# Patient Record
Sex: Female | Born: 1965 | Race: White | Hispanic: No | Smoking: Current every day smoker
Health system: Southern US, Community
[De-identification: ages and names within clinical notes are randomized; demographics above are authoritative.]

## PROBLEM LIST (undated history)

## (undated) DIAGNOSIS — J189 Pneumonia, unspecified organism: Secondary | ICD-10-CM

## (undated) DIAGNOSIS — F419 Anxiety disorder, unspecified: Secondary | ICD-10-CM

## (undated) DIAGNOSIS — K219 Gastro-esophageal reflux disease without esophagitis: Secondary | ICD-10-CM

## (undated) DIAGNOSIS — K5090 Crohn's disease, unspecified, without complications: Secondary | ICD-10-CM

## (undated) DIAGNOSIS — F329 Major depressive disorder, single episode, unspecified: Secondary | ICD-10-CM

## (undated) DIAGNOSIS — G4700 Insomnia, unspecified: Secondary | ICD-10-CM

## (undated) DIAGNOSIS — I10 Essential (primary) hypertension: Secondary | ICD-10-CM

## (undated) DIAGNOSIS — F319 Bipolar disorder, unspecified: Secondary | ICD-10-CM

## (undated) DIAGNOSIS — F32A Depression, unspecified: Secondary | ICD-10-CM

## (undated) DIAGNOSIS — J45909 Unspecified asthma, uncomplicated: Secondary | ICD-10-CM

## (undated) HISTORY — DX: Gastro-esophageal reflux disease without esophagitis: K21.9

## (undated) HISTORY — DX: Unspecified asthma, uncomplicated: J45.909

## (undated) HISTORY — PX: KNEE SURGERY: SHX244

## (undated) HISTORY — PX: SHOULDER SURGERY: SHX246

## (undated) HISTORY — DX: Anxiety disorder, unspecified: F41.9

## (undated) HISTORY — DX: Pneumonia, unspecified organism: J18.9

## (undated) HISTORY — PX: ABDOMINAL HYSTERECTOMY: SHX81

## (undated) HISTORY — DX: Essential (primary) hypertension: I10

## (undated) HISTORY — PX: BACK SURGERY: SHX140

## (undated) HISTORY — PX: TUBAL LIGATION: SHX77

---

## 2003-11-07 ENCOUNTER — Emergency Department: Payer: Self-pay | Admitting: Internal Medicine

## 2003-11-23 ENCOUNTER — Emergency Department: Payer: Self-pay | Admitting: Emergency Medicine

## 2003-12-23 ENCOUNTER — Emergency Department: Payer: Self-pay | Admitting: Emergency Medicine

## 2004-02-01 ENCOUNTER — Emergency Department: Payer: Self-pay | Admitting: Emergency Medicine

## 2004-02-26 ENCOUNTER — Emergency Department (HOSPITAL_COMMUNITY): Admission: EM | Admit: 2004-02-26 | Discharge: 2004-02-26 | Payer: Self-pay | Admitting: Emergency Medicine

## 2004-06-12 ENCOUNTER — Emergency Department: Payer: Self-pay | Admitting: Emergency Medicine

## 2004-07-09 ENCOUNTER — Emergency Department: Payer: Self-pay | Admitting: Internal Medicine

## 2004-11-16 ENCOUNTER — Emergency Department: Payer: Self-pay | Admitting: Emergency Medicine

## 2004-11-26 ENCOUNTER — Emergency Department: Payer: Self-pay | Admitting: Unknown Physician Specialty

## 2006-01-10 ENCOUNTER — Emergency Department (HOSPITAL_COMMUNITY): Admission: EM | Admit: 2006-01-10 | Discharge: 2006-01-10 | Payer: Self-pay | Admitting: Emergency Medicine

## 2006-01-18 ENCOUNTER — Emergency Department (HOSPITAL_COMMUNITY): Admission: EM | Admit: 2006-01-18 | Discharge: 2006-01-18 | Payer: Self-pay | Admitting: Emergency Medicine

## 2006-12-03 ENCOUNTER — Ambulatory Visit (HOSPITAL_COMMUNITY): Admission: RE | Admit: 2006-12-03 | Discharge: 2006-12-03 | Payer: Self-pay | Admitting: Internal Medicine

## 2007-01-07 IMAGING — CR RIGHT ANKLE - COMPLETE 3+ VIEW
1 series · 3 of 3 positions shown · non-contrast
Comparison: none

REASON FOR EXAM: Pain
COMMENTS:

PROCEDURE:     DXR - DXR ANKLE RIGHT COMPLETE  - June 13, 2004 [DATE]
RESULT:     Five views of the RIGHT ankle show no fracture, dislocation or
other acute bony abnormality. The ankle mortise is well maintained.

[Series 1: view not recorded · 0.17mm/px · 3 of 3 slices shown]
[im 1/3]
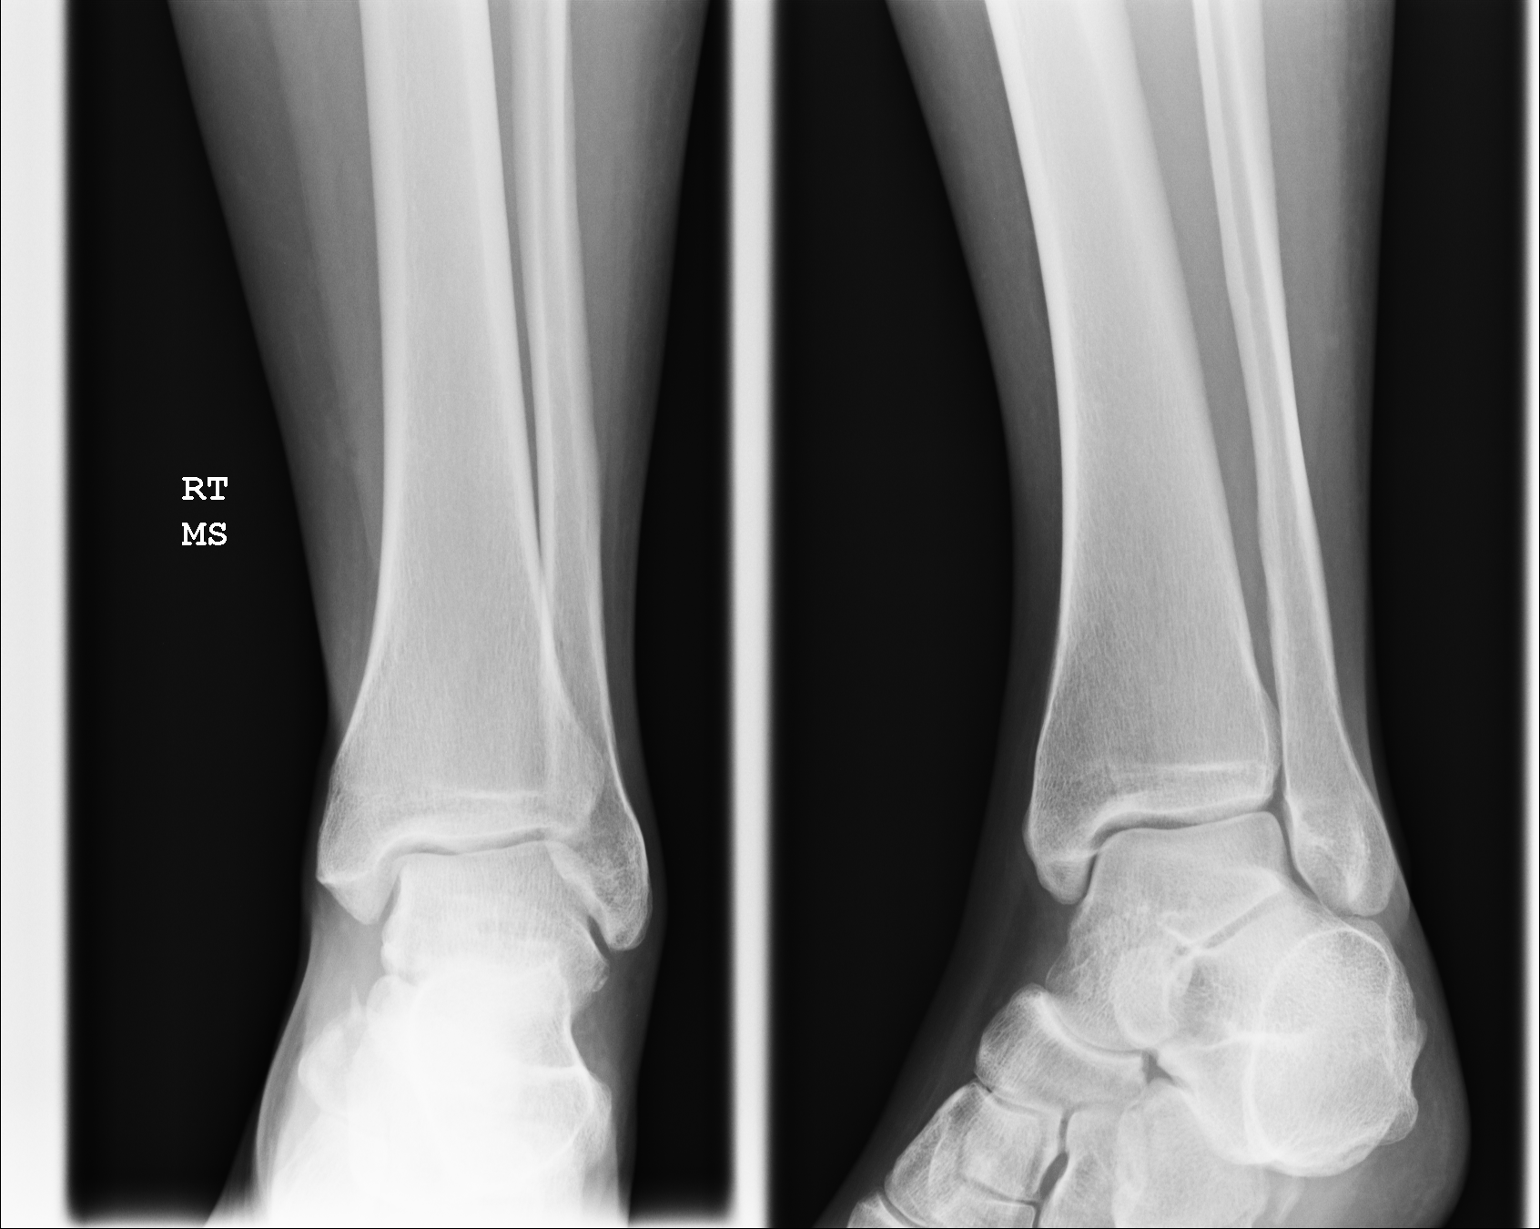
[im 2/3]
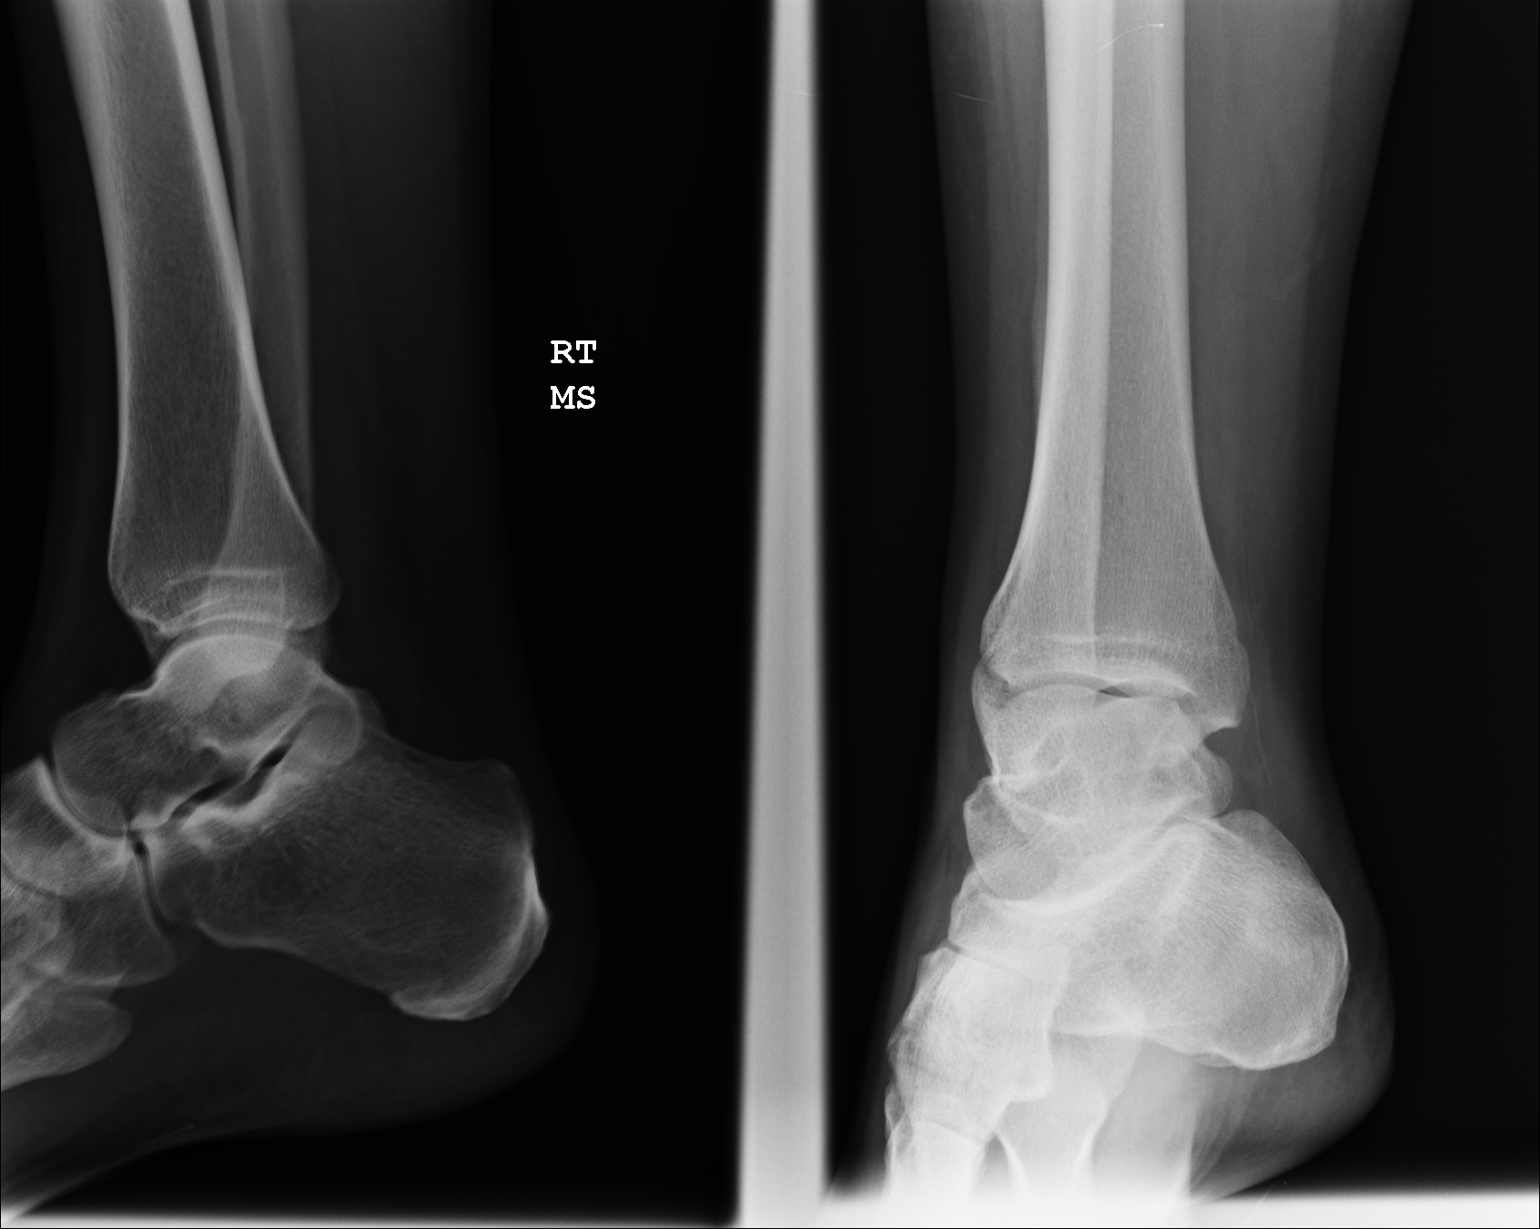
[im 3/3]
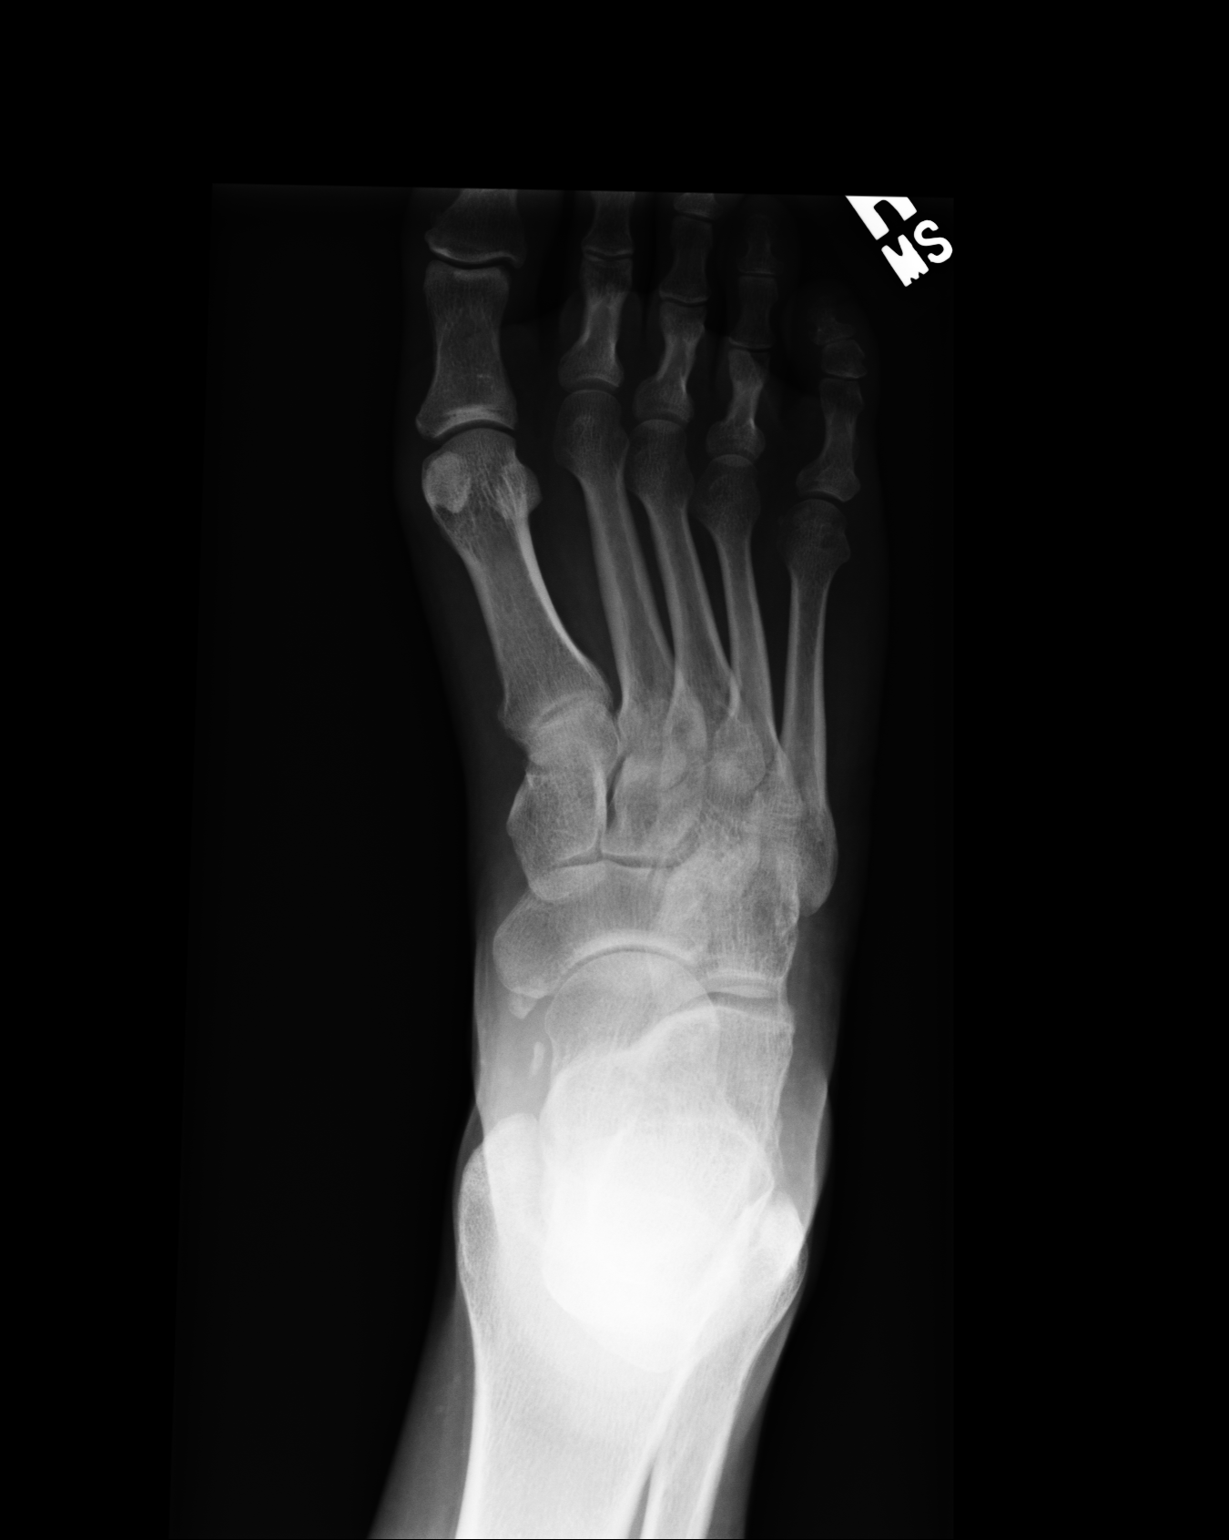

[3 of 3 positions shown; findings below may reference images not displayed]

IMPRESSION: No significant osseous abnormalities are noted.

## 2009-06-01 ENCOUNTER — Encounter: Admission: RE | Admit: 2009-06-01 | Discharge: 2009-06-01 | Payer: Self-pay | Admitting: Neurosurgery

## 2009-08-11 ENCOUNTER — Emergency Department (HOSPITAL_COMMUNITY): Admission: EM | Admit: 2009-08-11 | Discharge: 2009-08-11 | Payer: Self-pay | Admitting: Emergency Medicine

## 2009-11-02 ENCOUNTER — Emergency Department (HOSPITAL_COMMUNITY)
Admission: EM | Admit: 2009-11-02 | Discharge: 2009-11-02 | Payer: Self-pay | Source: Home / Self Care | Admitting: Emergency Medicine

## 2010-01-22 ENCOUNTER — Emergency Department (HOSPITAL_COMMUNITY)
Admission: EM | Admit: 2010-01-22 | Discharge: 2010-01-22 | Payer: Self-pay | Source: Home / Self Care | Admitting: Emergency Medicine

## 2010-02-11 ENCOUNTER — Encounter: Payer: Self-pay | Admitting: Neurosurgery

## 2010-04-02 ENCOUNTER — Ambulatory Visit (INDEPENDENT_AMBULATORY_CARE_PROVIDER_SITE_OTHER): Payer: Self-pay | Admitting: Internal Medicine

## 2010-05-28 ENCOUNTER — Ambulatory Visit (INDEPENDENT_AMBULATORY_CARE_PROVIDER_SITE_OTHER): Payer: Medicare Other | Admitting: Internal Medicine

## 2010-05-28 DIAGNOSIS — K5000 Crohn's disease of small intestine without complications: Secondary | ICD-10-CM

## 2010-05-28 DIAGNOSIS — K219 Gastro-esophageal reflux disease without esophagitis: Secondary | ICD-10-CM

## 2010-05-28 DIAGNOSIS — K5900 Constipation, unspecified: Secondary | ICD-10-CM

## 2010-05-28 DIAGNOSIS — R109 Unspecified abdominal pain: Secondary | ICD-10-CM

## 2010-06-08 NOTE — H&P (Signed)
NAME:  Amy Price                            ACCOUNT NO.:  [id number]   MEDICAL RECORD NO.:  [id number]                  PATIENT TYPE:   LOCATION:                                       FACILITY:  [facility]   PHYSICIAN:  R. Roetta Sessions, M.D.              DATE OF BIRTH:  1965-05-30   DATE OF ADMISSION:  07/12/2003  DATE OF DISCHARGE:                                HISTORY & PHYSICAL   PRIMARY CARE PHYSICIAN:  Dr. Burna Mortimer   CHIEF COMPLAINT:  Crohn's disease.   HISTORY OF PRESENT ILLNESS:  [name] is a 45 year old Caucasian female who  has been followed by Dr. Karilyn Cota for small bowel Crohn's disease.  She was  initially diagnosed in 51.  She had multiple hospitalizations which  resulted in a right hemicolectomy in November 1995.  She has been on  mesalamine.  She notes over the past month she has been out of all of her  medications due to a recent move.  Today she is complaining of rectal pain  as well as nausea, vomiting, heartburn, and indigestion however, she notes  discontinuing her Prilosec as well.  When she was on her PPI, she noted  resolution of her symptoms.  She is complaining of mucousy stools that are  loose and watery three or more times per day.  She denies any melena or  rectal bleeding.  Currently she is complaining of low-grade fever less than  100.  She is complaining of occasional abdominal cramping as well.  She  states she has not had colonoscopy since diagnosis in 1991.  She had small-  bowel follow-through in 2002 which did not show active disease at that time.   CURRENT MEDICATIONS:  1. OxyContin 40 mg q.i.d. for chronic back pain.  2. Neurontin 300 mg three at bedtime.  3. Trazodone 100 mg at bedtime.   ALLERGIES:  ASPIRIN, MORPHINE, CODEINE, and DARVOCET-N 100.   PAST MEDICAL HISTORY:  1. Insomnia.  2. Anxiety.  3. Crohn's disease diagnosed in 1991 status post right hemicolectomy in     November 1995, see HPI.  4. Chronic reflux esophagitis.  5.  Chronic back pain status post three surgeries 1996, 1997, and 1998     including fusion of her L-spine.  6. Tubal ligation.   FAMILY HISTORY:  She does have one son with reported ulcerative colitis.  No  known family history of colorectal carcinoma, liver or chronic GI problems.  She does have a mother with dermatomyositis, one sister with HCV and two  brothers alive and well.   SOCIAL HISTORY:  [name] is separated.  She lives with her boyfriend.  She  has a 64 year old and a 78 year old, one with colitis.  She is currently  disabled.  She reports smoking half a pack per day for the last 26 years.  She denies any alcohol or drug use.  REVIEW OF SYSTEMS:  CONSTITUTIONAL:  Weight is steadily increasing.  Appetite is okay.  She is complaining of some fatigue.  CARDIOVASCULAR:  Occasional chest pain.  She denies any palpitations.  PULMONARY:  She is  complaining of a productive cough over the last several weeks.  Denies any  hemoptysis, shortness of breath, or dyspnea.  GI:  See HPI.  She denies any  dysphagia or odynophagia.   PHYSICAL EXAMINATION:  VITAL SIGNS:  Weight 164 pounds, blood pressure  100/70, pulse 70.  GENERAL:  [name] is a 45 year old well-developed, well-nourished  Caucasian female in no acute distress.  HEENT:  Sclerae are clear, nonicteric.  Conjunctivae pink.  Oropharynx pink  and moist.  She has poor dentition.  NECK:  Supple without any mass or thyromegaly.  CHEST:  Heart regular rate and rhythm with normal S1 and S2 without any  murmurs, clicks, rubs, or gallops.  Lungs clear to auscultation bilaterally.  ABDOMEN:  She does have an umbilical ornament intact, positive bowel sounds  x4, no bruits auscultated, slightly distended, soft, she does have some  tenderness to deep palpation to bilateral lower quadrants.  No palpable mass  or hepatosplenomegaly.  RECTAL:  Exam reveals good sphincter tone, no external or internal lesions  visualized, small amount of  Hemoccult-negative stool was obtained from the  vault.   ASSESSMENT:  [name] is a 45 year old Caucasian female with 14-year  history of small bowel Crohn's disease.  She has been off maintenance  medications for 1 month now due to a recent move.  She is complaining of  some proctalgia as well as loose mucousy stools.  She denies any rectal  bleeding at this time.  Her symptoms appear to be somewhat quiescent  although further evaluation is necessary to assess the status of her Crohn's  disease at this time.   RECOMMENDATIONS:  1. Will schedule colonoscopy with terminal ileoscopy in the near future.  I     have discussed this procedure including risks and benefits which include     but are not limited to bleeding, infection, perforation, or drug     reaction.  She agrees with the plan.  Consent will be obtained.  Labs     today to include CBC, sed rate and complete metabolic panel.  She is to     follow up with Dr. Burna Mortimer for a full physical exam.  2. Will resume Prilosec 20 mg daily for her chronic GERD (#30 with six     refills), Levsin sublingual one before every meal and at bedtime p.r.n.     abdominal cramping (#60 with no refills).  3. Pentasa 250 mg one tab p.o. q.i.d., however this may need to be adjusted     depending on her disease state.     _____________________________________  ___________________________________________  Nicholas Lose, N.P.               Jonathon Bellows, M.D.   [facility]  D:  07/13/2003  T:  07/13/2003  Job:  [id number]   cc:   Dr. Burna Mortimer

## 2010-06-12 ENCOUNTER — Other Ambulatory Visit: Payer: Self-pay | Admitting: Medical

## 2010-07-04 ENCOUNTER — Other Ambulatory Visit: Payer: Self-pay | Admitting: Medical

## 2010-09-17 ENCOUNTER — Encounter (INDEPENDENT_AMBULATORY_CARE_PROVIDER_SITE_OTHER): Payer: Self-pay | Admitting: *Deleted

## 2010-10-11 ENCOUNTER — Encounter: Payer: Self-pay | Admitting: *Deleted

## 2010-10-11 ENCOUNTER — Emergency Department (HOSPITAL_COMMUNITY)
Admission: EM | Admit: 2010-10-11 | Discharge: 2010-10-11 | Disposition: A | Discharge status: Home / Self Care | Payer: Medicare Other | Attending: Emergency Medicine | Admitting: Emergency Medicine

## 2010-10-11 DIAGNOSIS — G44209 Tension-type headache, unspecified, not intractable: Secondary | ICD-10-CM

## 2010-10-11 DIAGNOSIS — R51 Headache: Secondary | ICD-10-CM | POA: Insufficient documentation

## 2010-10-11 HISTORY — DX: Bipolar disorder, unspecified: F31.9

## 2010-10-11 HISTORY — DX: Crohn's disease, unspecified, without complications: K50.90

## 2010-10-11 HISTORY — DX: Major depressive disorder, single episode, unspecified: F32.9

## 2010-10-11 HISTORY — DX: Depression, unspecified: F32.A

## 2010-10-11 HISTORY — DX: Insomnia, unspecified: G47.00

## 2010-10-11 MED ORDER — OXYCODONE-ACETAMINOPHEN 5-325 MG PO TABS: 1.0000 | ORAL_TABLET | Freq: Four times a day (QID) | ORAL | Status: AC | PRN

## 2010-10-11 NOTE — ED Notes (Signed)
Pt c/o pain to back of head. Pt states she started hurting 3-4 weeks ago. Pt states she falls sometimes and does not know it d/t sleeping pills she takes.

## 2010-10-11 NOTE — ED Provider Notes (Signed)
History     CSN: [id number] Arrival date & time: 10/11/2010  3:20 PM  Chief Complaint  Patient presents with  . Headache    pain to back of head.    HPI  (Consider location/radiation/quality/duration/timing/severity/associated sxs/prior treatment)  HPI Pt reports she fell about 2 weeks ago hitting the back of her head. Apparently this is not uncommon for her when she takes her night time trazodone. She states since that time, she has had sharp severe pain in R occipital region, radiating to her frontal area. Some associated neck stiffness, also chronic. She has tried ibuprofen without improvement.  Past Medical History  Diagnosis Date  . Insomnia   . Crohn's disease   . Bipolar 1 disorder   . Migraine   . Depression     Past Surgical History  Procedure Date  . Back surgery   . Knee surgery   . Abdominal hysterectomy   . Tubal ligation     History reviewed. No pertinent family history.  History  Substance Use Topics  . Smoking status: Current Everyday Smoker -- 0.5 packs/day  . Smokeless tobacco: Not on file  . Alcohol Use: No    OB History    Grav Para Term Preterm Abortions TAB SAB Ect Mult Living                  Review of Systems  Review of Systems All other systems reviewed and are negative except as noted in HPI.   Allergies  Aspirin; Darvocet; Imitrex; and Tramadol  Home Medications   Current Outpatient Rx  Name Route Sig Dispense Refill  . TRAZODONE HCL 150 MG PO TABS Oral Take 450 mg by mouth at bedtime.        Physical Exam    BP 137/82  Pulse 102  Temp(Src) 98.5 F (36.9 C) (Oral)  Resp 20  Ht 5\' 7"  (1.702 m)  Wt 240 lb (108.863 kg)  BMI 37.59 kg/m2  SpO2 99%  Physical Exam  Nursing note and vitals reviewed. Constitutional: She is oriented to person, place, and time. She appears well-developed and well-nourished.  HENT:  Head: Normocephalic and atraumatic.       R occipital scalp tenderness without hematoma, rash or laceration    Eyes: EOM are normal. Pupils are equal, round, and reactive to light.  Neck: Normal range of motion. Neck supple.       nontender in midline, paraspinal tenderness  Cardiovascular: Normal rate, normal heart sounds and intact distal pulses.   Pulmonary/Chest: Effort normal and breath sounds normal.  Abdominal: Bowel sounds are normal. She exhibits no distension. There is no tenderness.  Musculoskeletal: Normal range of motion. She exhibits no edema and no tenderness.  Neurological: She is alert and oriented to person, place, and time. She has normal strength. No cranial nerve deficit or sensory deficit.  Skin: Skin is warm and dry. No rash noted.  Psychiatric: She has a normal mood and affect.    ED Course  Procedures (including critical care time)    MDM There is no concern for clinically significant intracranial injury. Suspect tension headache vs chronic pain syndrome. Given short course of narcotics. Advised not to take them at night with trazodone due to additive sedative effects. PCP followup.         Charles B. Bernette Mayers, MD 10/11/10 [id number]

## 2010-11-13 ENCOUNTER — Encounter (INDEPENDENT_AMBULATORY_CARE_PROVIDER_SITE_OTHER): Payer: Self-pay | Admitting: Internal Medicine

## 2010-11-13 ENCOUNTER — Ambulatory Visit (INDEPENDENT_AMBULATORY_CARE_PROVIDER_SITE_OTHER): Payer: Medicare Other | Admitting: Internal Medicine

## 2010-11-13 VITALS — BP 120/90 | HR 78 | Temp 97.8°F | Resp 16 | Ht 67.0 in | Wt 259.0 lb

## 2010-11-13 DIAGNOSIS — R635 Abnormal weight gain: Secondary | ICD-10-CM

## 2010-11-13 DIAGNOSIS — R1011 Right upper quadrant pain: Secondary | ICD-10-CM

## 2010-11-13 DIAGNOSIS — J329 Chronic sinusitis, unspecified: Secondary | ICD-10-CM

## 2010-11-13 LAB — HEPATIC FUNCTION PANEL
Albumin: 4.1 g/dL (ref 3.5–5.2)
Total Protein: 7.1 g/dL (ref 6.0–8.3)

## 2010-11-13 MED ORDER — CEPHALEXIN 500 MG PO CAPS: 500.0000 mg | ORAL_CAPSULE | Freq: Four times a day (QID) | ORAL | Status: AC

## 2010-11-13 NOTE — Patient Instructions (Addendum)
FYI. You have gained 42 pounds in the last 5 months. Need to increase physical activity or walking. Physician will contact you with results of blood test and ultrasound

## 2010-11-14 NOTE — Progress Notes (Signed)
Presenting complaint; Followup for Crohn's disease and GERD. Patient complains of right upper quadrant pain.  Subjective: Patient is 45 year old Caucasian female who is in for scheduled visit with multiple complaints. She was last seen in may this year. She complains of pain under the right rib cage radiating posteriorly worse after meals and associated with intermittent nausea and vomiting. She has not experienced  fever or chills. This pain started a few weeks ago. She states she has great appetite and just cannot control herself. She has gained 42 pounds in less than 6 months. She remains with irregular bowel movements. She either has diarrhea and or constipation. She denies melena or frank rectal bleeding. She has occasional hematochezia with straining. She also complains of headache in the frontal and maxillary regions and has thick greenish nasal discharge. She states her her medication for bipolar disorder is being changed. Current medications Current Outpatient Prescriptions  Medication Sig Dispense Refill  . amitriptyline (ELAVIL) 50 MG tablet Take 50 mg by mouth at bedtime.        . benztropine (COGENTIN) 1 MG tablet Take 1 mg by mouth Nightly.        . chlorproMAZINE (THORAZINE) 25 MG tablet Take 25 mg by mouth. At bedtime        . gabapentin (NEURONTIN) 600 MG tablet Take 600 mg by mouth 4 (four) times daily.        Marland Kitchen ibuprofen (ADVIL,MOTRIN) 800 MG tablet Take 800 mg by mouth as needed.        . OXcarbazepine (TRILEPTAL) 150 MG tablet Take 150 mg by mouth. Takes 3 by mouth at bedtime       . pantoprazole (PROTONIX) 40 MG tablet Take 40 mg by mouth 2 (two) times daily.        . traZODone (DESYREL) 150 MG tablet Take 450 mg by mouth at bedtime. Takes 1/2 tablet at bedtime      . cephALEXin (KEFLEX) 500 MG capsule Take 1 capsule (500 mg total) by mouth 4 (four) times daily.  40 capsule  0     Objective: BP 120/90  Pulse 78  Temp(Src) 97.8 F (36.6 C) (Oral)  Resp 16  Ht 5\' 7"   (1.702 m)  Wt 259 lb (117.482 kg)  BMI 40.57 kg/m2 Conjunctiva is pink. Sclera is nonicteric. She has mild tenderness over frontal and maxillary sinuses oropharyngeal mucosa is normal. No neck masses or thyromegaly noted. Lungs are clear to auscultation. Abdomen is full bowel sounds are normal. He has mild tenderness below the right costal margin but no organomegaly or masses noted. No peripheral edema or clubbing the    Assessment: #1. Crohn's disease. She appears to be in remission presently on no medications. Her irregular bowel movements appear to be due to irritable bowel syndrome. #2. Obesity. Her BMI is over 40. She has gained 42 pounds in less than 6 months. This may be related to her medications. Needs to rule out hypothyroidism #3. Maxillary and frontal sinusitis. #4. Right upper quadrant pain with nausea and vomiting rule out biliary tract disease. #5. Chronic GERD.     Plan: Keflex.500 mg 4 times a day for 10 days Right upper quadrant ultrasound. LFTs and TSH level.    [name],[name]  11/14/2010, 12:28 AM

## 2010-11-15 ENCOUNTER — Ambulatory Visit (HOSPITAL_COMMUNITY)
Admission: RE | Admit: 2010-11-15 | Discharge: 2010-11-15 | Disposition: A | Discharge status: Home / Self Care | Payer: Medicare Other | Source: Ambulatory Visit | Attending: Internal Medicine | Admitting: Internal Medicine

## 2010-11-15 DIAGNOSIS — R935 Abnormal findings on diagnostic imaging of other abdominal regions, including retroperitoneum: Secondary | ICD-10-CM | POA: Insufficient documentation

## 2010-11-15 DIAGNOSIS — R1011 Right upper quadrant pain: Secondary | ICD-10-CM | POA: Insufficient documentation

## 2011-02-19 ENCOUNTER — Ambulatory Visit (INDEPENDENT_AMBULATORY_CARE_PROVIDER_SITE_OTHER): Payer: Medicare Other | Admitting: Urology

## 2011-02-19 ENCOUNTER — Other Ambulatory Visit (INDEPENDENT_AMBULATORY_CARE_PROVIDER_SITE_OTHER): Payer: Self-pay | Admitting: Internal Medicine

## 2011-02-19 DIAGNOSIS — R3913 Splitting of urinary stream: Secondary | ICD-10-CM

## 2011-02-19 DIAGNOSIS — R350 Frequency of micturition: Secondary | ICD-10-CM

## 2011-02-19 DIAGNOSIS — R39198 Other difficulties with micturition: Secondary | ICD-10-CM

## 2011-04-02 ENCOUNTER — Ambulatory Visit: Payer: Medicare Other | Admitting: Urology

## 2011-05-28 ENCOUNTER — Ambulatory Visit: Payer: Medicare Other | Admitting: Urology

## 2011-06-06 DIAGNOSIS — R072 Precordial pain: Secondary | ICD-10-CM

## 2011-09-17 ENCOUNTER — Other Ambulatory Visit (INDEPENDENT_AMBULATORY_CARE_PROVIDER_SITE_OTHER): Payer: Self-pay | Admitting: Internal Medicine

## 2012-02-17 ENCOUNTER — Ambulatory Visit (INDEPENDENT_AMBULATORY_CARE_PROVIDER_SITE_OTHER): Payer: PRIVATE HEALTH INSURANCE | Admitting: Internal Medicine

## 2012-02-17 ENCOUNTER — Encounter (INDEPENDENT_AMBULATORY_CARE_PROVIDER_SITE_OTHER): Payer: Self-pay | Admitting: Internal Medicine

## 2012-02-17 VITALS — BP 100/64 | HR 60 | Temp 98.3°F | Ht 67.0 in | Wt 266.9 lb

## 2012-02-17 DIAGNOSIS — G4700 Insomnia, unspecified: Secondary | ICD-10-CM | POA: Insufficient documentation

## 2012-02-17 DIAGNOSIS — F319 Bipolar disorder, unspecified: Secondary | ICD-10-CM | POA: Insufficient documentation

## 2012-02-17 DIAGNOSIS — F329 Major depressive disorder, single episode, unspecified: Secondary | ICD-10-CM | POA: Insufficient documentation

## 2012-02-17 DIAGNOSIS — K5090 Crohn's disease, unspecified, without complications: Secondary | ICD-10-CM

## 2012-02-17 DIAGNOSIS — I10 Essential (primary) hypertension: Secondary | ICD-10-CM | POA: Insufficient documentation

## 2012-02-17 LAB — CBC WITH DIFFERENTIAL/PLATELET
Basophils Absolute: 0 10*3/uL (ref 0.0–0.1)
Basophils Relative: 0 % (ref 0–1)
Eosinophils Absolute: 0.2 10*3/uL (ref 0.0–0.7)
Eosinophils Relative: 3 % (ref 0–5)
Lymphs Abs: 2.7 10*3/uL (ref 0.7–4.0)
MCHC: 34.7 g/dL (ref 30.0–36.0)
MCV: 84.9 fL (ref 78.0–100.0)
Monocytes Absolute: 0.6 10*3/uL (ref 0.1–1.0)
WBC: 7.5 10*3/uL (ref 4.0–10.5)

## 2012-02-17 NOTE — Progress Notes (Signed)
Subjective:     Patient ID: Amy Price, female   DOB: 13-May-1965, 47 y.o.   MRN: [id number]  HPIHere today with c/o rectal bleeding. She tells me a couple of weeks ago she had rectal bleeding. She noticed bright red bleeding on the toilet tissue. She says it occurred for several days and then stopped. No rectal bleeding since last week.  She has a hx of Crohn's disease dating back to 19091. She is status post right hemicolectomy in November 1994.  She has gained over 40 pounds since her last visit in May of 2012. She has a BM once a day. Today she has already had 3 BMs. Her stools are green in color. No melena or bright red rectal bleeding. She has not been on Pentasa for approximately 2 yrs. She tells me she was passing the pills whole. Occasional pain with her BMs. Occasional abdominal. She does have a lot of gas. Sometimes she will have fecal incontinence  at night.  Colonoscopy 2009 Dr. Karilyn Cota:  Colon, sigmoid, biopsy: No significant abnormality identified Review of Systems see hpi Current Outpatient Prescriptions  Medication Sig Dispense Refill  . amitriptyline (ELAVIL) 50 MG tablet Take 50 mg by mouth at bedtime.       . benztropine (COGENTIN) 1 MG tablet Take 1 mg by mouth Nightly.        . chlorproMAZINE (THORAZINE) 25 MG tablet Take 25 mg by mouth. At bedtime        . gabapentin (NEURONTIN) 600 MG tablet Take 600 mg by mouth 4 (four) times daily.        Marland Kitchen ibuprofen (ADVIL,MOTRIN) 800 MG tablet Take 800 mg by mouth as needed.        . OXcarbazepine (TRILEPTAL) 150 MG tablet Take 150 mg by mouth. Takes 3 by mouth at bedtime       . pantoprazole (PROTONIX) 40 MG tablet TAKE 1 TABLET BY MOUTH TWICE DAILY  60 tablet  5  . traZODone (DESYREL) 150 MG tablet Take 450 mg by mouth at bedtime. Takes 1/2 tablet at bedtime       Past Medical History  Diagnosis Date  . Insomnia   . Crohn's disease   . Bipolar 1 disorder   . Migraine   . Depression    Past Surgical History  Procedure  Date  . Back surgery   . Knee surgery   . Abdominal hysterectomy   . Tubal ligation   . Back surgery     stimulator removed   .    Objective:   Physical Exam Filed Vitals:   02/17/12 1007  BP: 100/64  Pulse: 60  Temp: 98.3 F (36.8 C)  Height: 5\' 7"  (1.702 m)  Weight: 266 lb 14.4 oz (121.065 kg)   Alert and oriented. Skin warm and dry. Oral mucosa is moist.   . Sclera anicteric, conjunctivae is pink. Thyroid not enlarged. No cervical lymphadenopathy. Lungs clear. Heart regular rate and rhythm.  Abdomen is soft. Bowel sounds are positive. No hepatomegaly. No abdominal masses felt. No tenderness.  No edema to lower extremities. Stool brown and guaiac negative.    Assessment:    Crohn's disease. I think she is in remission. Off Pentasa for 2 yrs.  Discussed this case with Dr Karilyn Cota    Plan:    OV in 1 yr. CBC and CRP today. Rx for Pentasa 1gm QID

## 2012-02-17 NOTE — Patient Instructions (Addendum)
CBC, CRP, OV in 1 yr. pentasa 1gm qid. Discussed with Dr. Karilyn Cota

## 2012-02-20 ENCOUNTER — Telehealth (INDEPENDENT_AMBULATORY_CARE_PROVIDER_SITE_OTHER): Payer: Self-pay | Admitting: Internal Medicine

## 2012-02-20 NOTE — Telephone Encounter (Signed)
Reviewed

## 2012-05-26 ENCOUNTER — Other Ambulatory Visit (INDEPENDENT_AMBULATORY_CARE_PROVIDER_SITE_OTHER): Payer: Self-pay | Admitting: Internal Medicine

## 2012-10-07 ENCOUNTER — Ambulatory Visit: Payer: Self-pay | Admitting: Family Medicine

## 2012-10-16 ENCOUNTER — Ambulatory Visit (INDEPENDENT_AMBULATORY_CARE_PROVIDER_SITE_OTHER): Payer: Medicare Other | Admitting: Family Medicine

## 2012-10-16 ENCOUNTER — Encounter: Payer: Self-pay | Admitting: Family Medicine

## 2012-10-16 VITALS — BP 98/69 | HR 69 | Temp 97.5°F | Ht 68.0 in | Wt 254.0 lb

## 2012-10-16 DIAGNOSIS — F411 Generalized anxiety disorder: Secondary | ICD-10-CM

## 2012-10-16 MED ORDER — ALPRAZOLAM 1 MG PO TABS: 1.0000 mg | ORAL_TABLET | Freq: Three times a day (TID) | ORAL | Status: DC | PRN

## 2012-10-16 NOTE — Progress Notes (Signed)
  Subjective:    Patient ID: Amy Price, female    DOB: 09-19-1965, 47 y.o.   MRN: [id number]  HPI This 47 y.o. female presents for evaluation of establishment visit.  She has hx Of bipolar disorder and she needs refill on xanax because her pain doctor does not rx it anymore.  She is seeing Dr. 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  She has hx of back surgery and knee surgery.  She has hx of Hypertension,chron's disease, and bipolar disorder.  She is accompanied by her Sister.  She states she has been sleeping a lot and would like to have some of her Psychiatric medicine adjusted.  She is not seeing psychiatry.  She states she was told She needs haldol shots .   Review of Systems    No chest pain, SOB, HA, dizziness, vision change, N/V, diarrhea, constipation, dysuria, urinary urgency or frequency, myalgias, arthralgias or rash.  Objective:   Physical Exam   Vital signs noted  Well developed well nourished female.  HEENT - Head atraumatic Normocephalic                Eyes - PERRLA, Conjuctiva - clear Sclera- Clear EOMI                Ears - EAC's Wnl TM's Wnl Gross Hearing WNL                Nose - Nares patent                 Throat - oropharanx wnl Respiratory - Lungs CTA bilateral Cardiac - RRR S1 and S2 without murmur GI - Abdomen soft Nontender and bowel sounds active x 4 Extremities - No edema. Neuro - Grossly intact.     Assessment & Plan:  Anxiety state, unspecified - Plan: ALPRAZolam Prudy Feeler) 1 MG tablet Informed patient that she needs to see a psychiatrist and a hand out is given to patient to get A psychiatrist.  I discussed with patient that I will bridge her until she is seen by psychiatry.

## 2012-10-16 NOTE — Patient Instructions (Signed)

## 2012-12-14 ENCOUNTER — Encounter: Payer: Self-pay | Admitting: *Deleted

## 2012-12-15 ENCOUNTER — Other Ambulatory Visit: Payer: Self-pay | Admitting: Family Medicine

## 2012-12-21 MED ORDER — CHLORPROMAZINE HCL 25 MG PO TABS: ORAL_TABLET | ORAL | Status: DC

## 2012-12-21 NOTE — Telephone Encounter (Signed)
SEE LAST OFFICE NOTE, I DONT THINK U HAVE PRESCRIBED THIS

## 2013-01-01 ENCOUNTER — Telehealth: Payer: Self-pay | Admitting: Family Medicine

## 2013-01-15 ENCOUNTER — Ambulatory Visit: Payer: Medicare Other | Admitting: Family Medicine

## 2013-01-18 ENCOUNTER — Ambulatory Visit (INDEPENDENT_AMBULATORY_CARE_PROVIDER_SITE_OTHER): Payer: Medicare Other | Admitting: Family Medicine

## 2013-01-18 ENCOUNTER — Encounter: Payer: Self-pay | Admitting: Family Medicine

## 2013-01-18 VITALS — BP 135/88 | HR 108 | Temp 96.7°F | Ht 68.0 in | Wt 236.0 lb

## 2013-01-18 DIAGNOSIS — G4700 Insomnia, unspecified: Secondary | ICD-10-CM

## 2013-01-18 DIAGNOSIS — M129 Arthropathy, unspecified: Secondary | ICD-10-CM

## 2013-01-18 DIAGNOSIS — I10 Essential (primary) hypertension: Secondary | ICD-10-CM

## 2013-01-18 DIAGNOSIS — G894 Chronic pain syndrome: Secondary | ICD-10-CM

## 2013-01-18 DIAGNOSIS — M1990 Unspecified osteoarthritis, unspecified site: Secondary | ICD-10-CM

## 2013-01-18 DIAGNOSIS — R51 Headache: Secondary | ICD-10-CM

## 2013-01-18 MED ORDER — METOPROLOL SUCCINATE ER 25 MG PO TB24: 25.0000 mg | ORAL_TABLET | Freq: Every day | ORAL | Status: AC

## 2013-01-18 MED ORDER — DULOXETINE HCL 60 MG PO CPEP: 60.0000 mg | ORAL_CAPSULE | Freq: Every day | ORAL | Status: DC

## 2013-01-18 MED ORDER — TIZANIDINE HCL 2 MG PO TABS: 2.0000 mg | ORAL_TABLET | Freq: Three times a day (TID) | ORAL | Status: DC | PRN

## 2013-01-18 MED ORDER — TOPIRAMATE 25 MG PO TABS: 50.0000 mg | ORAL_TABLET | Freq: Every day | ORAL | Status: AC

## 2013-01-18 MED ORDER — IBUPROFEN 800 MG PO TABS: 800.0000 mg | ORAL_TABLET | ORAL | Status: DC | PRN

## 2013-01-18 MED ORDER — AMITRIPTYLINE HCL 50 MG PO TABS: 50.0000 mg | ORAL_TABLET | Freq: Every day | ORAL | Status: DC

## 2013-01-18 MED ORDER — HYDROCHLOROTHIAZIDE 12.5 MG PO CAPS: 12.5000 mg | ORAL_CAPSULE | Freq: Every day | ORAL | Status: AC

## 2013-01-18 NOTE — Patient Instructions (Signed)

## 2013-01-18 NOTE — Progress Notes (Signed)
   Subjective:    Patient ID: Amy Price, female    DOB: 04/11/1965, 47 y.o.   MRN: [id number]  HPI This 47 y.o. female presents for evaluation of chronic pain.  She has been seen by Her orthopedic doctor for shoulder pain and was being rx'd oxycodone and she want a refill. She has chronic shoulder pain.  She has hx of migraine headaches, back pain, muscle spasms, Hypertension, bipolar disorder, anxiety disorder, chron's disease, depression, and insomnia.  She Was given a list of psychiatric providers to call and get established last visit and she has not done this. She is accompanied by her sister today.   Review of Systems No chest pain, SOB, HA, dizziness, vision change, N/V, diarrhea, constipation, dysuria, urinary urgency or frequency, myalgias, arthralgias or rash.     Objective:   Physical Exam Vital signs noted  Well developed well nourished female.  HEENT - Head atraumatic Normocephalic                Eyes - PERRLA, Conjuctiva - clear Sclera- Clear EOMI                Ears - EAC's Wnl TM's Wnl Gross Hearing WNL                Nose - Nares patent                 Throat - oropharanx wnl Respiratory - Lungs CTA bilateral Cardiac - RRR S1 and S2 without murmur GI - Abdomen soft Nontender and bowel sounds active x 4 Extremities - No edema. Neuro - Grossly intact.        Assessment & Plan:  Chronic pain syndrome - Plan: tiZANidine (ZANAFLEX) 2 MG tablet, Ambulatory referral to Pain Clinic, DULoxetine (CYMBALTA) 60 MG capsule, CBC With differential/Platelet.  Declined rx for oxycodone And will defer to pain management.  Insomnia - Plan: amitriptyline (ELAVIL) 50 MG tablet  Headache(784.0) - Plan: topiramate (TOPAMAX) 25 MG tablet, CBC With differential/Platelet  Essential hypertension, benign - Plan: hydrochlorothiazide (MICROZIDE) 12.5 MG capsule, metoprolol succinate (TOPROL-XL) 25 MG 24 hr tablet, BMP8+EGFR, Lipid panel, CBC With differential/Platelet, CANCELED:  POCT CBC  Arthritis - Plan: ibuprofen (ADVIL,MOTRIN) 800 MG tablet, CBC With differential/Platelet  Depression - Again advised her to get a psychiatric provider to rx her thorazine and xanax.  Deatra Canter FNP

## 2013-01-19 LAB — BMP8+EGFR
BUN/Creatinine Ratio: 5 — ABNORMAL LOW (ref 9–23)
BUN: 5 mg/dL — ABNORMAL LOW (ref 6–24)
CO2: 15 mmol/L — ABNORMAL LOW (ref 18–29)
Calcium: 9.6 mg/dL (ref 8.7–10.2)
Chloride: 105 mmol/L (ref 97–108)
Creatinine, Ser: 1 mg/dL (ref 0.57–1.00)
GFR calc Af Amer: 78 mL/min/{1.73_m2} (ref >59)
GFR calc non Af Amer: 67 mL/min/{1.73_m2} (ref >59)
Glucose: 103 mg/dL — ABNORMAL HIGH (ref 65–99)
Potassium: 4.2 mmol/L (ref 3.5–5.2)
Sodium: 141 mmol/L (ref 134–144)

## 2013-01-19 LAB — LIPID PANEL
Chol/HDL Ratio: 4 ratio (ref 0.0–4.4)
Cholesterol, Total: 186 mg/dL (ref 100–199)
HDL: 47 mg/dL
LDL Calculated: 100 mg/dL — ABNORMAL HIGH (ref 0–99)
Triglycerides: 193 mg/dL — ABNORMAL HIGH (ref 0–149)
VLDL Cholesterol Cal: 39 mg/dL (ref 5–40)

## 2013-01-19 LAB — CBC WITH DIFFERENTIAL
Basophils Absolute: 0 10*3/uL (ref 0.0–0.2)
Basos: 0 %
Eos: 3 %
Eosinophils Absolute: 0.2 10*3/uL (ref 0.0–0.4)
HCT: 45.9 % (ref 34.0–46.6)
Hemoglobin: 16.1 g/dL — ABNORMAL HIGH (ref 11.1–15.9)
Immature Grans (Abs): 0 10*3/uL (ref 0.0–0.1)
Immature Granulocytes: 0 %
Lymphocytes Absolute: 2.3 10*3/uL (ref 0.7–3.1)
Lymphs: 33 %
MCH: 31.9 pg (ref 26.6–33.0)
MCHC: 35.1 g/dL (ref 31.5–35.7)
MCV: 91 fL (ref 79–97)
Monocytes Absolute: 0.6 10*3/uL (ref 0.1–0.9)
Monocytes: 8 %
Neutrophils Absolute: 3.9 10*3/uL (ref 1.4–7.0)
Neutrophils Relative %: 56 %
Platelets: 279 10*3/uL (ref 150–379)
RBC: 5.05 x10E6/uL (ref 3.77–5.28)
RDW: 14.6 % (ref 12.3–15.4)
WBC: 7.1 10*3/uL (ref 3.4–10.8)

## 2013-02-01 ENCOUNTER — Telehealth: Payer: Self-pay

## 2013-02-01 NOTE — Telephone Encounter (Signed)
Suppose to be doing me a referral to Dr Jordan LikesSpivey Preferred pain management

## 2013-02-02 ENCOUNTER — Ambulatory Visit: Payer: Medicare Other | Admitting: Family Medicine

## 2013-02-03 ENCOUNTER — Encounter (INDEPENDENT_AMBULATORY_CARE_PROVIDER_SITE_OTHER): Payer: Self-pay | Admitting: *Deleted

## 2013-02-03 ENCOUNTER — Ambulatory Visit: Payer: Medicare Other | Admitting: Nurse Practitioner

## 2013-02-07 ENCOUNTER — Other Ambulatory Visit (INDEPENDENT_AMBULATORY_CARE_PROVIDER_SITE_OTHER): Payer: Self-pay | Admitting: Internal Medicine

## 2013-02-08 ENCOUNTER — Encounter (HOSPITAL_COMMUNITY): Payer: Self-pay | Admitting: Psychiatry

## 2013-02-08 ENCOUNTER — Ambulatory Visit (INDEPENDENT_AMBULATORY_CARE_PROVIDER_SITE_OTHER): Payer: PRIVATE HEALTH INSURANCE | Admitting: Psychiatry

## 2013-02-08 VITALS — BP 120/90 | Ht 68.0 in | Wt 233.0 lb

## 2013-02-08 DIAGNOSIS — F4310 Post-traumatic stress disorder, unspecified: Secondary | ICD-10-CM

## 2013-02-08 DIAGNOSIS — F411 Generalized anxiety disorder: Secondary | ICD-10-CM

## 2013-02-08 MED ORDER — ALPRAZOLAM 1 MG PO TABS: ORAL_TABLET | ORAL | Status: DC

## 2013-02-08 MED ORDER — MIRTAZAPINE 30 MG PO TABS: 30.0000 mg | ORAL_TABLET | Freq: Every day | ORAL | Status: DC
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
## 2013-02-17 ENCOUNTER — Ambulatory Visit (INDEPENDENT_AMBULATORY_CARE_PROVIDER_SITE_OTHER): Payer: PRIVATE HEALTH INSURANCE | Admitting: Internal Medicine

## 2013-02-17 ENCOUNTER — Telehealth (INDEPENDENT_AMBULATORY_CARE_PROVIDER_SITE_OTHER): Payer: Self-pay | Admitting: *Deleted

## 2013-02-17 ENCOUNTER — Ambulatory Visit (INDEPENDENT_AMBULATORY_CARE_PROVIDER_SITE_OTHER): Payer: PRIVATE HEALTH INSURANCE | Admitting: Psychiatry

## 2013-02-17 ENCOUNTER — Encounter (HOSPITAL_COMMUNITY): Payer: Self-pay | Admitting: Psychiatry

## 2013-02-17 ENCOUNTER — Encounter (INDEPENDENT_AMBULATORY_CARE_PROVIDER_SITE_OTHER): Payer: Self-pay | Admitting: Internal Medicine

## 2013-02-17 ENCOUNTER — Other Ambulatory Visit (INDEPENDENT_AMBULATORY_CARE_PROVIDER_SITE_OTHER): Payer: Self-pay | Admitting: *Deleted

## 2013-02-17 VITALS — BP 140/100 | Ht 67.0 in | Wt 236.0 lb

## 2013-02-17 VITALS — BP 112/84 | HR 116 | Temp 97.0°F | Ht 67.0 in | Wt 236.9 lb

## 2013-02-17 DIAGNOSIS — Z1211 Encounter for screening for malignant neoplasm of colon: Secondary | ICD-10-CM

## 2013-02-17 DIAGNOSIS — K5090 Crohn's disease, unspecified, without complications: Secondary | ICD-10-CM

## 2013-02-17 DIAGNOSIS — F411 Generalized anxiety disorder: Secondary | ICD-10-CM

## 2013-02-17 LAB — CBC WITH DIFFERENTIAL/PLATELET
BASOS PCT: 0 % (ref 0–1)
Basophils Absolute: 0 10*3/uL (ref 0.0–0.1)
EOS ABS: 0.3 10*3/uL (ref 0.0–0.7)
EOS PCT: 3 % (ref 0–5)
HEMATOCRIT: 47.8 % — AB (ref 36.0–46.0)
HEMOGLOBIN: 17.1 g/dL — AB (ref 12.0–15.0)
Lymphocytes Relative: 39 % (ref 12–46)
Lymphs Abs: 4.2 10*3/uL — ABNORMAL HIGH (ref 0.7–4.0)
MCH: 31.6 pg (ref 26.0–34.0)
MCHC: 35.8 g/dL (ref 30.0–36.0)
MCV: 88.4 fL (ref 78.0–100.0)
MONO ABS: 0.8 10*3/uL (ref 0.1–1.0)
MONOS PCT: 7 % (ref 3–12)
Neutro Abs: 5.4 10*3/uL (ref 1.7–7.7)
Neutrophils Relative %: 51 % (ref 43–77)
Platelets: 323 10*3/uL (ref 150–400)
RBC: 5.41 MIL/uL — ABNORMAL HIGH (ref 3.87–5.11)
RDW: 14 % (ref 11.5–15.5)
WBC: 10.8 10*3/uL — ABNORMAL HIGH (ref 4.0–10.5)

## 2013-02-17 LAB — C-REACTIVE PROTEIN: CRP: <0.5 mg/dL (ref <0.60)

## 2013-02-17 MED ORDER — MESALAMINE ER 500 MG PO CPCR: 500.0000 mg | ORAL_CAPSULE | Freq: Four times a day (QID) | ORAL | Status: DC

## 2013-02-17 MED ORDER — PEG-KCL-NACL-NASULF-NA ASC-C 100 G PO SOLR: 1.0000 | Freq: Once | ORAL | Status: DC

## 2013-02-17 MED ORDER — MESALAMINE ER 500 MG PO CPCR: 1000.0000 mg | ORAL_CAPSULE | Freq: Four times a day (QID) | ORAL | Status: DC

## 2013-02-17 MED ORDER — TRAZODONE HCL 50 MG PO TABS: 50.0000 mg | ORAL_TABLET | Freq: Every day | ORAL | Status: DC

## 2013-02-17 MED ORDER — CHLORPROMAZINE HCL 100 MG PO TABS: 100.0000 mg | ORAL_TABLET | Freq: Three times a day (TID) | ORAL | Status: DC

## 2013-02-17 NOTE — Telephone Encounter (Signed)
Patient needs movi prep 

## 2013-02-17 NOTE — Progress Notes (Signed)
Subjective:     Patient ID: Amy Price, female   DOB: 07/30/1965, 48 y.o.   MRN: [id number]  HPI Here today for f/u of her Crohn's disease. She tells me she is walking every day.  Her appetite is good. She has lost approximately 30 pounds since her last visit since her last visit in January of 2011. Her weight loss was intentional. She usually has a BM every 4 days and she says it is painful.  She is not on a stool softener at this time. She has lower abdominal pain with her BMs.  She has seen some blood with her BM when she is constipated. She says she had to strain to have a BM.  She has acid reflux. She has been taking Protonix x 2 at night.  She tells me her heart rate is up because of the new medication she is on for sleep. (Remeron). [facility].  She has never had a problems with a fast heart rate in the past.   Hx of small bowel Crohn disease diagnosed in 1991 and she had a rt hemicolectomy in November 1995.  Hx Her last [facility]. She had normal  Colonic mucosa. She had normal ileocolonic anastomosis.  EGD in April 2009 revealed Mallory-Weiss tear, nonerosive antral gastritis and H. Pylori was negative.  Gastric emptying study in May of 2009 revealed T 1/2 of 95 minutes which is felt be upper limit of normal.  She has hx of chronic GERD. She has chronic low back pain.   Review of Systems     Current Outpatient Prescriptions  Medication Sig Dispense Refill  . ALPRAZolam (XANAX) 1 MG tablet Take one four times a day  120 tablet  3  . amitriptyline (ELAVIL) 50 MG tablet Take 1 tablet (50 mg total) by mouth at bedtime.  30 tablet  11  . DULoxetine (CYMBALTA) 60 MG capsule Take 1 capsule (60 mg total) by mouth daily.  30 capsule  11  . gabapentin (NEURONTIN) 600 MG tablet Take 600 mg by mouth 4 (four) times daily.        . hydrochlorothiazide (MICROZIDE) 12.5 MG capsule Take 1 capsule (12.5 mg total) by mouth daily.   30 capsule  11  . ibuprofen (ADVIL,MOTRIN) 800 MG tablet Take 1 tablet (800 mg total) by mouth as needed.  30 tablet  5  . mesalamine (PENTASA) 500 MG CR capsule Take 1 capsule (500 mg total) by mouth 4 (four) times daily.  120 capsule  3  . metoprolol succinate (TOPROL-XL) 25 MG 24 hr tablet Take 1 tablet (25 mg total) by mouth daily.  30 tablet  11  . norethindrone (AYGESTIN) 5 MG tablet       . pantoprazole (PROTONIX) 40 MG tablet TAKE 1 TABLET BY MOUTH TWICE DAILY  60 tablet  5  . tiZANidine (ZANAFLEX) 2 MG tablet Take 1 tablet (2 mg total) by mouth every 8 (eight) hours as needed.  30 tablet  3  . topiramate (TOPAMAX) 25 MG tablet Take 2 tablets (50 mg total) by mouth daily.  30 tablet  11  . triamcinolone cream (KENALOG) 0.1 %        No current facility-administered medications for this visit.   Past Medical History  Diagnosis Date  . Insomnia   . Crohn's disease   . Bipolar 1 disorder   . Migraine   . Depression   .  Anxiety   . GERD (gastroesophageal reflux disease)    Past Surgical History  Procedure Laterality Date  . Back surgery    . Knee surgery    . Abdominal hysterectomy    . Tubal ligation    . Back surgery      stimulator removed  . Shoulder surgery     Allergies  Allergen Reactions  . Aspirin Nausea And Vomiting  . Darvocet [Propoxyphene N-Acetaminophen] Nausea And Vomiting  . Imitrex [Sumatriptan Base] Nausea And Vomiting  . Tramadol Nausea And Vomiting  . Toradol [Ketorolac Tromethamine] Other (See Comments)    Makes skin burn    Objective:   Physical Exam  Filed Vitals:   02/17/13 0905  BP: 112/84  Pulse: 116  Temp: 97 F (36.1 C)  Height: 5\' 7"  (1.702 m)  Weight: 236 lb 14.4 oz (107.457 kg)   Alert and oriented. Skin warm and dry. Oral mucosa is moist.   . Sclera anicteric, conjunctivae is pink. Thyroid not enlarged. No cervical lymphadenopathy. Lungs clear. Heart regular rate and rhythm.  Abdomen is soft. Bowel sounds are positive. No  hepatomegaly. No abdominal masses felt. No tenderness.  No edema to lower extremities.      Assessment:    Hx of Crohn's disease.  She seems to be in remission at this time.  Really no abdominal pain except with the BMs. Last colonoscopy in 2009. She is due for surveillance colonoscopy.    Plan:    Will restart Pentasa 1 gm four times a day. CBC and CRP. OV in 1 yr with Dr. Karilyn Cota.  Needs a surveillance colonoscopy for Crohns. HOB up. Take Protonix 40mg  30 minutes before breakfast and 30 minutes before supper.

## 2013-02-17 NOTE — Patient Instructions (Signed)
CBC, Crp. Take Protonix 30 minutes before breakfast and 30 minutes before supper. Take Pentasa 1gm QID.
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
## 2013-02-23 ENCOUNTER — Ambulatory Visit (INDEPENDENT_AMBULATORY_CARE_PROVIDER_SITE_OTHER): Payer: PRIVATE HEALTH INSURANCE | Admitting: Psychiatry

## 2013-02-23 DIAGNOSIS — F4310 Post-traumatic stress disorder, unspecified: Secondary | ICD-10-CM

## 2013-02-24 ENCOUNTER — Telehealth (INDEPENDENT_AMBULATORY_CARE_PROVIDER_SITE_OTHER): Payer: Self-pay | Admitting: Internal Medicine

## 2013-02-24 NOTE — Telephone Encounter (Signed)
error 
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
## 2013-02-24 NOTE — Patient Instructions (Signed)
Discussed orally 

## 2013-03-02 ENCOUNTER — Encounter (HOSPITAL_COMMUNITY): Payer: Self-pay

## 2013-03-08 ENCOUNTER — Encounter (HOSPITAL_COMMUNITY): Payer: Self-pay | Admitting: Psychiatry

## 2013-03-08 ENCOUNTER — Ambulatory Visit (INDEPENDENT_AMBULATORY_CARE_PROVIDER_SITE_OTHER): Payer: PRIVATE HEALTH INSURANCE | Admitting: Psychiatry

## 2013-03-08 ENCOUNTER — Telehealth: Payer: Self-pay | Admitting: Family Medicine

## 2013-03-08 VITALS — BP 130/90 | Ht 68.0 in | Wt 241.0 lb

## 2013-03-08 DIAGNOSIS — F4310 Post-traumatic stress disorder, unspecified: Secondary | ICD-10-CM

## 2013-03-08 MED ORDER — GABAPENTIN 600 MG PO TABS: 1200.0000 mg | ORAL_TABLET | Freq: Three times a day (TID) | ORAL | Status: DC

## 2013-03-08 MED ORDER — CHLORPROMAZINE HCL 100 MG PO TABS: 200.0000 mg | ORAL_TABLET | Freq: Every day | ORAL | Status: DC
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
## 2013-03-10 ENCOUNTER — Ambulatory Visit (HOSPITAL_COMMUNITY)
Admission: RE | Admit: 2013-03-10 | Discharge: 2013-03-10 | Disposition: A | Discharge status: Home / Self Care | Payer: PRIVATE HEALTH INSURANCE | Source: Ambulatory Visit | Attending: Internal Medicine | Admitting: Internal Medicine

## 2013-03-10 ENCOUNTER — Encounter (HOSPITAL_COMMUNITY): Payer: Self-pay | Admitting: *Deleted

## 2013-03-10 ENCOUNTER — Encounter (HOSPITAL_COMMUNITY)
Admission: RE | Disposition: A | Discharge status: Home / Self Care | Payer: Self-pay | Source: Ambulatory Visit | Attending: Internal Medicine

## 2013-03-10 DIAGNOSIS — K921 Melena: Secondary | ICD-10-CM | POA: Insufficient documentation

## 2013-03-10 DIAGNOSIS — K5090 Crohn's disease, unspecified, without complications: Secondary | ICD-10-CM

## 2013-03-10 DIAGNOSIS — Z8719 Personal history of other diseases of the digestive system: Secondary | ICD-10-CM | POA: Insufficient documentation

## 2013-03-10 DIAGNOSIS — K644 Residual hemorrhoidal skin tags: Secondary | ICD-10-CM | POA: Insufficient documentation

## 2013-03-10 DIAGNOSIS — R109 Unspecified abdominal pain: Secondary | ICD-10-CM | POA: Insufficient documentation

## 2013-03-10 HISTORY — PX: COLONOSCOPY: SHX5424

## 2013-03-10 SURGERY — COLONOSCOPY
Anesthesia: Moderate Sedation

## 2013-03-10 MED ORDER — DICYCLOMINE HCL 10 MG PO CAPS: 10.0000 mg | ORAL_CAPSULE | Freq: Three times a day (TID) | ORAL | Status: DC

## 2013-03-10 MED ORDER — MIDAZOLAM HCL 5 MG/5ML IJ SOLN
INTRAMUSCULAR | Status: AC
  Filled 2013-03-10: qty 10

## 2013-03-10 MED ORDER — SODIUM CHLORIDE 0.9 % IJ SOLN
INTRAMUSCULAR | Status: AC
  Filled 2013-03-10: qty 30

## 2013-03-10 MED ORDER — STERILE WATER FOR IRRIGATION IR SOLN
Status: DC | PRN
  Administered 2013-03-10: 13:00:00

## 2013-03-10 MED ORDER — MIDAZOLAM HCL 5 MG/5ML IJ SOLN
INTRAMUSCULAR | Status: AC
  Filled 2013-03-10: qty 5

## 2013-03-10 MED ORDER — SODIUM CHLORIDE 0.9 % IV SOLN
INTRAVENOUS | Status: DC
  Administered 2013-03-10: 1000 mL via INTRAVENOUS

## 2013-03-10 MED ORDER — PROMETHAZINE HCL 25 MG/ML IJ SOLN
INTRAMUSCULAR | Status: AC
  Filled 2013-03-10: qty 1

## 2013-03-10 MED ORDER — MIDAZOLAM HCL 5 MG/5ML IJ SOLN
INTRAMUSCULAR | Status: DC | PRN
  Administered 2013-03-10 (×2): 2 mg via INTRAVENOUS
  Administered 2013-03-10 (×2): 3 mg via INTRAVENOUS

## 2013-03-10 MED ORDER — MEPERIDINE HCL 50 MG/ML IJ SOLN
INTRAMUSCULAR | Status: DC | PRN
  Administered 2013-03-10 (×2): 25 mg via INTRAVENOUS

## 2013-03-10 MED ORDER — MEPERIDINE HCL 50 MG/ML IJ SOLN
INTRAMUSCULAR | Status: AC
  Filled 2013-03-10: qty 1

## 2013-03-10 MED ORDER — PROMETHAZINE HCL 25 MG/ML IJ SOLN
INTRAMUSCULAR | Status: DC | PRN
  Administered 2013-03-10: 12.5 mg via INTRAVENOUS

## 2013-03-10 NOTE — Discharge Instructions (Signed)
Resume usual medications except ibuprofen; take ibuprofen on an as-needed basis and no more than 200-400 mg at a time and no more than twice a day. Resume usual diet. Dicyclomine 10 mg by mouth daily before each meal. No driving for 24 hours. Office visit in 6 months.   Colonoscopy Care After These instructions give you information on caring for yourself after your procedure. Your doctor may also give you more specific instructions. Call your doctor if you have any problems or questions after your procedure. HOME CARE  Take it easy for the next 24 hours.  Rest.  Walk or use warm packs on your belly (abdomen) if you have belly cramping or gas.  Do not drive for 24 hours.  You may shower.  Do not sign important papers or use machinery for 24 hours.  Drink enough fluids to keep your pee (urine) clear or pale yellow.  Resume your normal diet. Avoid heavy or fried foods.  Avoid alcohol.  Continue taking your normal medicines.  Only take medicine as told by your doctor. Do not take aspirin. If you had growths (polyps) removed:  Do not take aspirin.  Do not drink alcohol for 7 days or as told by your doctor.  Eat a soft diet for 24 hours. GET HELP RIGHT AWAY IF:  You have a fever.  You pass clumps of tissue (blood clots) or fill the toilet with blood.  You have belly pain that gets worse and medicine does not help.  Your belly is puffy (swollen).  You feel sick to your stomach (nauseous) or throw up (vomit). MAKE SURE YOU:  Understand these instructions.  Will watch your condition.  Will get help right away if you are not doing well or get worse. Document Released: 02/09/2010 Document Revised: 04/01/2011 Document Reviewed: 09/14/2012 [facility] Patient Information 2014 [place], [place].

## 2013-03-10 NOTE — H&P (Addendum)
Amy Price is an 48 y.o. female.   Chief Complaint: Patient is here for colonoscopy. HPI: Patient is 48 year old Caucasian female with history of Crohn disease who is here for surveillance exam. She has intermittent hematochezia felt to be secondary to hemorrhoids. She complains of mid abdominal pain every time she has a bowel movement. She is having 1-2 stools daily. Most of her stools loose. She says she has lost 40 pounds last year by changing her eating habits.  Past Medical History  Diagnosis Date  . Insomnia   . Crohn's disease   . Bipolar 1 disorder   . Migraine   . Depression   . Anxiety   . GERD (gastroesophageal reflux disease)     Past Surgical History  Procedure Laterality Date  . Back surgery    . Knee surgery    . Abdominal hysterectomy    . Tubal ligation    . Back surgery      stimulator removed  . Shoulder surgery      Family History  Problem Relation Age of Onset  . Diabetes Mother   . Arthritis Sister   . Osteoporosis Sister   . Depression Sister   . Alcohol abuse Sister   . Diabetes Brother   . Depression Brother   . Colitis Son   . Healthy Son   . Alcohol abuse Father    Social History:  reports that she has been smoking.  She has never used smokeless tobacco. She reports that she does not drink alcohol or use illicit drugs.  Allergies:  Allergies  Allergen Reactions  . Aspirin Nausea And Vomiting  . Darvocet [Propoxyphene N-Acetaminophen] Nausea And Vomiting  . Imitrex [Sumatriptan Base] Nausea And Vomiting  . Tramadol Nausea And Vomiting  . Toradol [Ketorolac Tromethamine] Other (See Comments)    Makes skin burn    Medications Prior to Admission  Medication Sig Dispense Refill  . albuterol (PROVENTIL HFA;VENTOLIN HFA) 108 (90 BASE) MCG/ACT inhaler Inhale 2 puffs into the lungs daily.      Marland Kitchen. ALPRAZolam (XANAX) 1 MG tablet Take one four times a day  120 tablet  3  . beclomethasone (QVAR) 80 MCG/ACT inhaler Inhale 1 puff into the lungs  2 (two) times daily.      . chlorproMAZINE (THORAZINE) 100 MG tablet Take 2 tablets (200 mg total) by mouth at bedtime.  60 tablet  2  . DULoxetine (CYMBALTA) 60 MG capsule Take 1 capsule (60 mg total) by mouth daily.  30 capsule  11  . gabapentin (NEURONTIN) 600 MG tablet Take 2 tablets (1,200 mg total) by mouth 3 (three) times daily.  120 tablet  2  . hydrochlorothiazide (MICROZIDE) 12.5 MG capsule Take 1 capsule (12.5 mg total) by mouth daily.  30 capsule  11  . ibuprofen (ADVIL,MOTRIN) 800 MG tablet Take 800 mg by mouth every 8 (eight) hours as needed for mild pain.      . mesalamine (PENTASA) 500 MG CR capsule Take 1 capsule (500 mg total) by mouth 4 (four) times daily.  120 capsule  3  . metoprolol succinate (TOPROL-XL) 25 MG 24 hr tablet Take 1 tablet (25 mg total) by mouth daily.  30 tablet  11  . norethindrone (AYGESTIN) 5 MG tablet Take 5 mg by mouth daily.       . pantoprazole (PROTONIX) 40 MG tablet TAKE 1 TABLET BY MOUTH TWICE DAILY  60 tablet  5  . tiZANidine (ZANAFLEX) 2 MG tablet Take 1 tablet (2  mg total) by mouth every 8 (eight) hours as needed.  30 tablet  3  . topiramate (TOPAMAX) 25 MG tablet Take 2 tablets (50 mg total) by mouth daily.  30 tablet  11  . traZODone (DESYREL) 50 MG tablet Take 1 tablet (50 mg total) by mouth at bedtime.  30 tablet  2  . triamcinolone cream (KENALOG) 0.1 % Apply 1 application topically daily as needed (rash).         No results found for this or any previous visit (from the past 48 hour(s)). No results found.  ROS  Blood pressure 119/78, pulse 90, temperature 97.6 F (36.4 C), temperature source Oral, resp. rate 25, height 5\' 7"  (1.702 m), weight 241 lb (109.317 kg), SpO2 100.00%. Physical Exam  Constitutional: She appears well-developed and well-nourished.  HENT:  Mouth/Throat: Oropharynx is clear and moist.  Eyes: Conjunctivae are normal. No scleral icterus.  Neck: No thyromegaly present.  Cardiovascular: Normal rate, regular rhythm  and normal heart sounds.   No murmur heard. Respiratory: Effort normal and breath sounds normal.  GI: Soft. She exhibits no distension and no mass. Tenderness: mild midepigastric tenderness.  Musculoskeletal: She exhibits no edema.  Lymphadenopathy:    She has no cervical adenopathy.  Neurological: She is alert.  Skin: Skin is warm and dry.  Tattoos over upper extremities     Assessment/Plan History of Crohn disease. Abdominal pain and hematochezia. Diagnostic and surveillance colonoscopy.  AmyNAJEEB Price 03/10/2013, 1:22 PM

## 2013-03-10 NOTE — Op Note (Signed)
COLONOSCOPY PROCEDURE REPORT  PATIENT:  Amy Price  MR#:  [id number] Birthdate:  01-16-1966, 48 y.o., female Endoscopist:  Dr. Malissa Hippo, MD Referred By:  Anselm Pancoast. [place], FNP  Procedure Date: 03/10/2013  Procedure:   Colonoscopy  Indications:  Patient is 48 year old Caucasian female with history of Crohn disease who presents with abdominal pain and intermittent hematochezia. She is undergoing colonoscopy to both for diagnostic and surveillance purposes.  Informed Consent:  The procedure and risks were reviewed with the patient and informed consent was obtained.  Medications:  Demerol 50 mg IV Versed 10 mg IV Promethazine 12.5 mg IV and diluted form.  Description of procedure:  After a digital rectal exam was performed, that colonoscope was advanced from the anus through the rectum and colon to the area of hepatic flexure were ileocolonic anastomosis was identified. Scope was passed into distal small bowel for about 15 centimeters. From this level scope was slowly and cautiously withdrawn. The mucosal surfaces were carefully surveyed utilizing scope tip to flexion to facilitate fold flattening as needed. The scope was pulled down into the rectum where a thorough exam including retroflexion was performed.  Findings:    Prep satisfactory. Normal mucosa of distal small bowel. Wide-open ileocolonic anastomosis without ulceration. Normal mucosa of colon and rectum. Small hemorrhoids below the dentate line.    Therapeutic/Diagnostic Maneuvers Performed:  None  Complications:  None  Cecal Withdrawal Time:  11 minutes  Impression:  Normal mucosa of distal small bowel. Patent ileocolonic anastomosis. Normal colonoscopy except external hemorrhoids.  Comment; Crohn's disease and appears to be in remission and she may have IBS. If symptoms do not respond to dicyclomine will consider small bowel follow-through.  Recommendations:  Standard instructions given. Dicyclomine  10 mg by mouth 3 times a day. Office visit in 6 months.  Pietro Bonura U  03/10/2013 2:07 PM  CC: Dr. Purcell Nails, Anselm Pancoast, FNP & Dr. Bonnetta Barry ref. provider found

## 2013-03-12 ENCOUNTER — Encounter (HOSPITAL_COMMUNITY): Payer: Self-pay | Admitting: Internal Medicine

## 2013-03-15 ENCOUNTER — Ambulatory Visit: Payer: Medicare Other | Admitting: Family Medicine

## 2013-03-16 ENCOUNTER — Ambulatory Visit: Payer: Medicare Other | Admitting: Family Medicine

## 2013-03-19 ENCOUNTER — Ambulatory Visit (HOSPITAL_COMMUNITY): Payer: Self-pay | Admitting: Psychiatry

## 2013-04-02 ENCOUNTER — Ambulatory Visit (HOSPITAL_COMMUNITY): Payer: Self-pay | Admitting: Psychiatry

## 2013-04-06 ENCOUNTER — Ambulatory Visit (INDEPENDENT_AMBULATORY_CARE_PROVIDER_SITE_OTHER): Payer: PRIVATE HEALTH INSURANCE | Admitting: Psychiatry

## 2013-04-06 DIAGNOSIS — F4310 Post-traumatic stress disorder, unspecified: Secondary | ICD-10-CM
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
## 2013-04-06 NOTE — Patient Instructions (Signed)
Discussed orally 

## 2013-04-09 ENCOUNTER — Ambulatory Visit (HOSPITAL_COMMUNITY): Payer: Self-pay | Admitting: Psychiatry

## 2013-04-09 ENCOUNTER — Telehealth (HOSPITAL_COMMUNITY): Payer: Self-pay | Admitting: *Deleted

## 2013-04-13 ENCOUNTER — Ambulatory Visit (INDEPENDENT_AMBULATORY_CARE_PROVIDER_SITE_OTHER): Payer: PRIVATE HEALTH INSURANCE | Admitting: Psychiatry

## 2013-04-13 ENCOUNTER — Encounter (HOSPITAL_COMMUNITY): Payer: Self-pay | Admitting: Psychiatry

## 2013-04-13 VITALS — BP 120/80 | Ht 67.0 in | Wt 245.0 lb

## 2013-04-13 DIAGNOSIS — F411 Generalized anxiety disorder: Secondary | ICD-10-CM

## 2013-04-13 DIAGNOSIS — F4310 Post-traumatic stress disorder, unspecified: Secondary | ICD-10-CM

## 2013-04-13 MED ORDER — ALPRAZOLAM 1 MG PO TABS: ORAL_TABLET | ORAL | Status: DC

## 2013-04-13 MED ORDER — CHLORPROMAZINE HCL 100 MG PO TABS: 200.0000 mg | ORAL_TABLET | Freq: Every day | ORAL | Status: DC

## 2013-04-13 MED ORDER — GABAPENTIN 600 MG PO TABS: 1200.0000 mg | ORAL_TABLET | Freq: Three times a day (TID) | ORAL | Status: DC

## 2013-04-13 MED ORDER — AMITRIPTYLINE HCL 50 MG PO TABS: 50.0000 mg | ORAL_TABLET | Freq: Every day | ORAL | Status: DC
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
## 2013-04-19 ENCOUNTER — Ambulatory Visit (INDEPENDENT_AMBULATORY_CARE_PROVIDER_SITE_OTHER): Payer: Medicare Other | Admitting: Family Medicine

## 2013-04-19 VITALS — BP 118/82 | HR 76 | Temp 97.9°F | Ht 68.0 in | Wt 245.0 lb

## 2013-04-19 DIAGNOSIS — J45909 Unspecified asthma, uncomplicated: Secondary | ICD-10-CM

## 2013-04-19 DIAGNOSIS — G894 Chronic pain syndrome: Secondary | ICD-10-CM

## 2013-04-19 DIAGNOSIS — Z9109 Other allergy status, other than to drugs and biological substances: Secondary | ICD-10-CM

## 2013-04-19 DIAGNOSIS — B351 Tinea unguium: Secondary | ICD-10-CM

## 2013-04-19 MED ORDER — TERBINAFINE HCL 250 MG PO TABS: 250.0000 mg | ORAL_TABLET | Freq: Every day | ORAL | Status: DC

## 2013-04-19 MED ORDER — BACLOFEN 10 MG PO TABS: 10.0000 mg | ORAL_TABLET | Freq: Three times a day (TID) | ORAL | Status: AC

## 2013-04-19 MED ORDER — BECLOMETHASONE DIPROPIONATE 80 MCG/ACT IN AERS: 1.0000 | INHALATION_SPRAY | Freq: Two times a day (BID) | RESPIRATORY_TRACT | Status: DC

## 2013-04-19 MED ORDER — ALBUTEROL SULFATE HFA 108 (90 BASE) MCG/ACT IN AERS: 2.0000 | INHALATION_SPRAY | Freq: Four times a day (QID) | RESPIRATORY_TRACT | Status: DC | PRN

## 2013-04-19 MED ORDER — FLUTICASONE PROPIONATE 50 MCG/ACT NA SUSP
2.0000 | Freq: Every day | NASAL | Status: AC
Start: 2013-04-19 — End: ?

## 2013-04-19 NOTE — Progress Notes (Signed)
   Subjective:    Patient ID: Amy Price, female    DOB: [birth year], 48 y.o.   MRN: [id number]  HPI This 48 y.o. female presents for evaluation of hypertension.  She has hx of psychiatric illness and sees psychiatry and she sees a pain doctor for chronic pain.  She has mycotic toenail.  She states she took a muscle relaxer zanaflex and it made her hallucinate and she states her psychiatrist told her to get her pcp to rx another.  Review of Systems No chest pain, SOB, HA, dizziness, vision change, N/V, diarrhea, constipation, dysuria, urinary urgency or frequency, myalgias, arthralgias or rash.     Objective:   Physical Exam   Vital signs noted  Well developed well nourished female.  HEENT - Head atraumatic Normocephalic                Eyes - PERRLA, Conjuctiva - clear Sclera- Clear EOMI                Ears - EAC's Wnl TM's Wnl Gross Hearing WNL                Throat - oropharanx wnl Respiratory - Lungs CTA bilateral Cardiac - RRR S1 and S2 without murmur GI - Abdomen soft Nontender and bowel sounds active x 4 Extremities - No edema. Neuro - Grossly intact.     Assessment & Plan:  Unspecified asthma(493.90) - Plan: albuterol (PROAIR HFA) 108 (90 BASE) MCG/ACT inhaler, beclomethasone (QVAR) 80 MCG/ACT inhaler  Environmental allergies - Plan: fluticasone (FLONASE) 50 MCG/ACT nasal spray  Onychomycosis - Plan: terbinafine (LAMISIL) 250 MG tablet  Chronic pain syndrome - Plan: Ambulatory referral to Pain Clinic, baclofen (LIORESAL) 10 MG tablet  Deatra Canter FNP

## 2013-04-22 ENCOUNTER — Ambulatory Visit (HOSPITAL_COMMUNITY): Payer: Self-pay | Admitting: Psychiatry

## 2013-04-28 ENCOUNTER — Ambulatory Visit (INDEPENDENT_AMBULATORY_CARE_PROVIDER_SITE_OTHER): Payer: PRIVATE HEALTH INSURANCE | Admitting: Psychiatry

## 2013-04-28 DIAGNOSIS — F4310 Post-traumatic stress disorder, unspecified: Secondary | ICD-10-CM
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
## 2013-04-28 NOTE — Patient Instructions (Signed)
Discussed orally 

## 2013-05-18 ENCOUNTER — Telehealth (HOSPITAL_COMMUNITY): Payer: Self-pay | Admitting: *Deleted

## 2013-05-18 NOTE — Telephone Encounter (Signed)
noted 

## 2013-05-19 ENCOUNTER — Ambulatory Visit (HOSPITAL_COMMUNITY): Payer: Self-pay | Admitting: Psychiatry

## 2013-05-20 ENCOUNTER — Ambulatory Visit (HOSPITAL_COMMUNITY): Payer: Self-pay | Admitting: Psychiatry

## 2013-06-11 ENCOUNTER — Ambulatory Visit (HOSPITAL_COMMUNITY): Payer: Self-pay | Admitting: Psychiatry

## 2013-06-17 ENCOUNTER — Ambulatory Visit (HOSPITAL_COMMUNITY): Payer: Self-pay | Admitting: Psychiatry

## 2013-06-30 ENCOUNTER — Encounter (HOSPITAL_COMMUNITY): Payer: Self-pay | Admitting: Psychiatry

## 2013-06-30 ENCOUNTER — Ambulatory Visit (INDEPENDENT_AMBULATORY_CARE_PROVIDER_SITE_OTHER): Payer: PRIVATE HEALTH INSURANCE | Admitting: Psychiatry

## 2013-06-30 VITALS — BP 110/80 | Ht 67.0 in | Wt 255.0 lb

## 2013-06-30 DIAGNOSIS — F411 Generalized anxiety disorder: Secondary | ICD-10-CM

## 2013-06-30 DIAGNOSIS — G894 Chronic pain syndrome: Secondary | ICD-10-CM

## 2013-06-30 DIAGNOSIS — F4310 Post-traumatic stress disorder, unspecified: Secondary | ICD-10-CM

## 2013-06-30 MED ORDER — ALPRAZOLAM 1 MG PO TABS: ORAL_TABLET | ORAL | Status: DC

## 2013-06-30 MED ORDER — CHLORPROMAZINE HCL 100 MG PO TABS: 200.0000 mg | ORAL_TABLET | Freq: Every day | ORAL | Status: DC

## 2013-06-30 MED ORDER — BENZTROPINE MESYLATE 1 MG PO TABS: 1.0000 mg | ORAL_TABLET | Freq: Every day | ORAL | Status: DC

## 2013-06-30 MED ORDER — AMITRIPTYLINE HCL 50 MG PO TABS: 50.0000 mg | ORAL_TABLET | Freq: Every day | ORAL | Status: DC

## 2013-06-30 MED ORDER — TRAZODONE HCL 50 MG PO TABS: 50.0000 mg | ORAL_TABLET | Freq: Every day | ORAL | Status: DC
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
## 2013-07-05 ENCOUNTER — Telehealth: Payer: Self-pay | Admitting: Family Medicine

## 2013-07-05 NOTE — Telephone Encounter (Signed)
appt scheduled

## 2013-07-12 ENCOUNTER — Ambulatory Visit: Payer: Medicare Other | Admitting: Family Medicine

## 2013-07-20 ENCOUNTER — Ambulatory Visit (INDEPENDENT_AMBULATORY_CARE_PROVIDER_SITE_OTHER): Payer: Medicare Other | Admitting: Family Medicine

## 2013-07-20 ENCOUNTER — Other Ambulatory Visit: Payer: Self-pay | Admitting: Family Medicine

## 2013-07-20 ENCOUNTER — Encounter: Payer: Self-pay | Admitting: Family Medicine

## 2013-07-20 ENCOUNTER — Telehealth: Payer: Self-pay | Admitting: Family Medicine

## 2013-07-20 VITALS — BP 125/87 | HR 89 | Temp 97.0°F | Ht 68.0 in | Wt 253.0 lb

## 2013-07-20 DIAGNOSIS — R079 Chest pain, unspecified: Secondary | ICD-10-CM

## 2013-07-20 DIAGNOSIS — B372 Candidiasis of skin and nail: Secondary | ICD-10-CM

## 2013-07-20 DIAGNOSIS — R5381 Other malaise: Secondary | ICD-10-CM

## 2013-07-20 DIAGNOSIS — R3911 Hesitancy of micturition: Secondary | ICD-10-CM

## 2013-07-20 DIAGNOSIS — R0683 Snoring: Secondary | ICD-10-CM

## 2013-07-20 DIAGNOSIS — B370 Candidal stomatitis: Secondary | ICD-10-CM

## 2013-07-20 DIAGNOSIS — Z0000 Encounter for general adult medical examination without abnormal findings: Secondary | ICD-10-CM

## 2013-07-20 DIAGNOSIS — R5383 Other fatigue: Secondary | ICD-10-CM

## 2013-07-20 DIAGNOSIS — G4700 Insomnia, unspecified: Secondary | ICD-10-CM

## 2013-07-20 LAB — POCT UA - MICROSCOPIC ONLY
Casts, Ur, LPF, POC: NEGATIVE
Crystals, Ur, HPF, POC: NEGATIVE
Mucus, UA: NEGATIVE
Yeast, UA: NEGATIVE

## 2013-07-20 LAB — POCT CBC
Granulocyte percent: 67.8 %G (ref 37–80)
HCT, POC: 45.7 % (ref 37.7–47.9)
Hemoglobin: 14.7 g/dL (ref 12.2–16.2)
Lymph, poc: 4 — AB (ref 0.6–3.4)
MCH, POC: 29.2 pg (ref 27–31.2)
MCHC: 32.1 g/dL (ref 31.8–35.4)
MCV: 90.8 fL (ref 80–97)
MPV: 8.8 fL (ref 0–99.8)
POC Granulocyte: 9.5 — AB (ref 2–6.9)
POC LYMPH PERCENT: 28.9 %L (ref 10–50)
Platelet Count, POC: 297 10*3/uL (ref 142–424)
RBC: 5 M/uL (ref 4.04–5.48)
RDW, POC: 13.5 %
WBC: 14 10*3/uL — AB (ref 4.6–10.2)

## 2013-07-20 LAB — POCT URINALYSIS DIPSTICK
Bilirubin, UA: NEGATIVE
Glucose, UA: NEGATIVE
Ketones, UA: NEGATIVE
Leukocytes, UA: NEGATIVE
Nitrite, UA: NEGATIVE
Spec Grav, UA: 1.01
Urobilinogen, UA: NEGATIVE
pH, UA: 6

## 2013-07-20 MED ORDER — NYSTATIN 100000 UNIT/ML MT SUSP: 5.0000 mL | Freq: Four times a day (QID) | OROMUCOSAL | Status: DC

## 2013-07-20 MED ORDER — FLUCONAZOLE 150 MG PO TABS: 150.0000 mg | ORAL_TABLET | Freq: Once | ORAL | Status: DC

## 2013-07-20 MED ORDER — AMOXICILLIN 875 MG PO TABS: 875.0000 mg | ORAL_TABLET | Freq: Two times a day (BID) | ORAL | Status: DC

## 2013-07-20 NOTE — Progress Notes (Signed)
Subjective:    Patient ID: Amy Price, female    DOB: 1965-03-09, 48 y.o.   MRN: [id number]  HPI This 48 y.o. female presents for evaluation of CPE without pap smear.  She is seeing OBGYN and is up to date with mammo and pap smear.  She is having some chest pain and some SOB.  She has Insomnia and wakes up a lot at night.  She is accompanied by family who state she quits breathing and jerks in her sleep.  She is a smoker and states she has cut back to one pack every 3 days.  She has hx of psychiatric illness and sees psychiatry. She states she gets chest pains on occasion that are sharp.  She has a red tongue and thinks she has thrush.  She gets this from her qvar inhaler. She has hx of asthma.  She had polycythemia on last lab.  She c/o OSAS sx's and she is a smoker. She c/o rash on right abdomen.   Review of Systems C/o chest pain, insomnia, rash   No  SOB, HA, dizziness, vision change, N/V, diarrhea, constipation, dysuria, urinary urgency or frequency, myalgias, arthralgias.  Objective:   Physical Exam  Vital signs noted  Well developed well nourished female.  HEENT - Head atraumatic Normocephalic                Eyes - PERRLA, Conjuctiva - clear Sclera- Clear EOMI                Ears - EAC's Wnl TM's Wnl Gross Hearing WNL                Nose - Nares patent                 Throat - oropharanx wnl Respiratory - Lungs CTA bilateral Cardiac - RRR S1 and S2 without murmur GI - Abdomen soft Nontender and bowel sounds active x 4 Extremities - No edema. Neuro - Grossly intact. Skin - Right abdomen fold with candida intertrigo  EKG - NSR without acute st-t changes Lysbeth Penner FNP  Results for orders placed in visit on 07/20/13  POCT CBC      Result Value Ref Range   WBC 14.0 (*) 4.6 - 10.2 K/uL   Lymph, poc 4.0 (*) 0.6 - 3.4   POC LYMPH PERCENT 28.9  10 - 50 %L   POC Granulocyte 9.5 (*) 2 - 6.9   Granulocyte percent 67.8  37 - 80 %G   RBC 5.0  4.04 - 5.48 M/uL   Hemoglobin 14.7  12.2 - 16.2 g/dL   HCT, POC 45.7  37.7 - 47.9 %   MCV 90.8  80 - 97 fL   MCH, POC 29.2  27 - 31.2 pg   MCHC 32.1  31.8 - 35.4 g/dL   RDW, POC 13.5     Platelet Count, POC 297.0  142 - 424 K/uL   MPV 8.8  0 - 99.8 fL  POCT UA - MICROSCOPIC ONLY      Result Value Ref Range   WBC, Ur, HPF, POC 1-5     RBC, urine, microscopic 1-3     Bacteria, U Microscopic occ     Mucus, UA neg     Epithelial cells, urine per micros occ     Crystals, Ur, HPF, POC neg     Casts, Ur, LPF, POC neg     Yeast, UA neg    POCT URINALYSIS  DIPSTICK      Result Value Ref Range   Color, UA gold     Clarity, UA clear     Glucose, UA neg     Bilirubin, UA neg     Ketones, UA neg     Spec Grav, UA 1.010     Blood, UA trace     pH, UA 6.0     Protein, UA 1+     Urobilinogen, UA negative     Nitrite, UA neg     Leukocytes, UA Negative        Assessment & Plan:  Routine general medical examination at a health care facility - Plan: POCT CBC, CMP14+EGFR, Lipid panel, Vit D  25 hydroxy (rtn osteoporosis monitoring), Vitamin B12, Thyroid Panel With TSH  Other malaise and fatigue - Plan: Vit D  25 hydroxy (rtn osteoporosis monitoring), Vitamin B12  Thrush - Plan: nystatin (MYCOSTATIN) 100000 UNIT/ML suspension  Candidal intertrigo - Plan: fluconazole (DIFLUCAN) 150 MG tablet  Snoring - Plan: Ambulatory referral to  Pulmonary for r/o osas  Insomnia - Plan: Ambulatory referral to Pulmonology r/o osas  Chest pain, unspecified chest pain type - Plan: DG Chest 2 View, EKG 12-Lead  Urinary hesitancy - Plan: POCT UA - Microscopic Only, POCT urinalysis dipstick  Tobacco abuse - Discussed she needs to quit.  Follow up in 2 weeks  Spent over 45 minutes in visit  Lysbeth Penner FNP

## 2013-07-21 LAB — CMP14+EGFR
ALT: 23 IU/L (ref 0–32)
AST: 17 IU/L (ref 0–40)
Albumin/Globulin Ratio: 1.4 (ref 1.1–2.5)
Albumin: 3.8 g/dL (ref 3.5–5.5)
Alkaline Phosphatase: 51 IU/L (ref 39–117)
BUN/Creatinine Ratio: 10 (ref 9–23)
BUN: 10 mg/dL (ref 6–24)
CO2: 20 mmol/L (ref 18–29)
Calcium: 9.1 mg/dL (ref 8.7–10.2)
Chloride: 104 mmol/L (ref 97–108)
Creatinine, Ser: 1 mg/dL (ref 0.57–1.00)
GFR calc Af Amer: 77 mL/min/{1.73_m2} (ref >59)
GFR calc non Af Amer: 67 mL/min/{1.73_m2} (ref >59)
Globulin, Total: 2.7 g/dL (ref 1.5–4.5)
Glucose: 164 mg/dL — ABNORMAL HIGH (ref 65–99)
Potassium: 4.5 mmol/L (ref 3.5–5.2)
Sodium: 142 mmol/L (ref 134–144)
Total Bilirubin: 0.3 mg/dL (ref 0.0–1.2)
Total Protein: 6.5 g/dL (ref 6.0–8.5)

## 2013-07-21 LAB — VITAMIN D 25 HYDROXY (VIT D DEFICIENCY, FRACTURES): Vit D, 25-Hydroxy: 29.6 ng/mL — ABNORMAL LOW (ref 30.0–100.0)

## 2013-07-21 LAB — LIPID PANEL
Chol/HDL Ratio: 2.4 ratio units (ref 0.0–4.4)
Cholesterol, Total: 140 mg/dL (ref 100–199)
HDL: 58 mg/dL (ref >39)
LDL Calculated: 63 mg/dL (ref 0–99)
Triglycerides: 96 mg/dL (ref 0–149)
VLDL Cholesterol Cal: 19 mg/dL (ref 5–40)

## 2013-07-21 LAB — THYROID PANEL WITH TSH
Free Thyroxine Index: 2.8 (ref 1.2–4.9)
T3 Uptake Ratio: 28 % (ref 24–39)
T4, Total: 10 ug/dL (ref 4.5–12.0)
TSH: 1.65 u[IU]/mL (ref 0.450–4.500)

## 2013-07-21 LAB — VITAMIN B12: Vitamin B-12: 236 pg/mL (ref 211–946)

## 2013-07-26 ENCOUNTER — Other Ambulatory Visit: Payer: Self-pay | Admitting: Family Medicine

## 2013-07-26 MED ORDER — CYANOCOBALAMIN 1000 MCG/ML IJ SOLN: INTRAMUSCULAR | Status: AC

## 2013-07-26 MED ORDER — VITAMIN D (ERGOCALCIFEROL) 1.25 MG (50000 UNIT) PO CAPS: 50000.0000 [IU] | ORAL_CAPSULE | ORAL | Status: DC

## 2013-08-03 ENCOUNTER — Ambulatory Visit: Payer: Medicare Other | Admitting: Family Medicine

## 2013-08-05 ENCOUNTER — Other Ambulatory Visit (INDEPENDENT_AMBULATORY_CARE_PROVIDER_SITE_OTHER): Payer: Self-pay | Admitting: Internal Medicine

## 2013-08-05 NOTE — Telephone Encounter (Signed)
Per Dr.[name] may fill with 5 refills 

## 2013-08-09 ENCOUNTER — Encounter: Payer: Self-pay | Admitting: Family Medicine

## 2013-08-09 ENCOUNTER — Ambulatory Visit (INDEPENDENT_AMBULATORY_CARE_PROVIDER_SITE_OTHER): Payer: Medicare Other | Admitting: Family Medicine

## 2013-08-09 VITALS — BP 115/80 | HR 97 | Temp 99.3°F | Ht 68.0 in | Wt 263.2 lb

## 2013-08-09 DIAGNOSIS — B372 Candidiasis of skin and nail: Secondary | ICD-10-CM

## 2013-08-09 DIAGNOSIS — R739 Hyperglycemia, unspecified: Secondary | ICD-10-CM

## 2013-08-09 DIAGNOSIS — R7309 Other abnormal glucose: Secondary | ICD-10-CM

## 2013-08-09 DIAGNOSIS — B370 Candidal stomatitis: Secondary | ICD-10-CM

## 2013-08-09 LAB — GLUCOSE, POCT (MANUAL RESULT ENTRY): POC Glucose: 99 mg/dl (ref 70–99)

## 2013-08-09 LAB — POCT GLYCOSYLATED HEMOGLOBIN (HGB A1C): Hemoglobin A1C: 4.8

## 2013-08-09 MED ORDER — KETOCONAZOLE 2 % EX CREA: 1.0000 "application " | TOPICAL_CREAM | Freq: Every day | CUTANEOUS | Status: DC

## 2013-08-09 MED ORDER — NYSTATIN 100000 UNIT/ML MT SUSP: 5.0000 mL | Freq: Four times a day (QID) | OROMUCOSAL | Status: DC

## 2013-08-09 MED ORDER — FLUCONAZOLE 150 MG PO TABS: 150.0000 mg | ORAL_TABLET | Freq: Once | ORAL | Status: DC

## 2013-08-09 NOTE — Progress Notes (Signed)
   Subjective:    Patient ID: Amy Price, female    DOB: 11/10/1965, 48 y.o.   MRN: [id number]  HPI This 48 y.o. female presents for evaluation of follow up on her rash and thrush.  She was tx'd with  Diflucan and nystatin and her rash and thrush are back.  She had recent labs showing leukocytosis, Vitamin D and b12 deficiency.  She has been having a rash in her groin area.  She had elevated Glucose and is awaiting hgbaic.   Review of Systems C/o rash   No chest pain, SOB, HA, dizziness, vision change, N/V, diarrhea, constipation, dysuria, urinary urgency or frequency, myalgias, arthralgias.  Objective:   Physical Exam  Vital signs noted  Well developed well nourished female.  HEENT - Head atraumatic Normocephalic                Eyes - PERRLA, Conjuctiva - clear Sclera- Clear EOMI                Ears - EAC's Wnl TM's Wnl Gross Hearing WNL                Nose - Nares patent                 Throat - oropharanx wnl Respiratory - Lungs CTA bilateral Cardiac - RRR S1 and S2 without murmur GI - Abdomen soft Nontender and bowel sounds active x 4 Extremities - No edema. Neuro - Grossly intact. Skin - rash right groin consistent with candida intertrigo  Results for orders placed in visit on 08/09/13  POCT GLYCOSYLATED HEMOGLOBIN (HGB A1C)      Result Value Ref Range   Hemoglobin A1C 4.8%    GLUCOSE, POCT (MANUAL RESULT ENTRY)      Result Value Ref Range   POC Glucose 99  70 - 99 mg/dl      Assessment & Plan:  Hyperglycemia - Plan: POCT glycosylated hemoglobin (Hb A1C), POCT glucose (manual entry)  Thrush - Plan: nystatin (MYCOSTATIN) 100000 UNIT/ML suspension  Candidal intertrigo - Plan: fluconazole (DIFLUCAN) 150 MG tablet, ketoconazole (NIZORAL) 2 % cream  Deatra Canter FNP

## 2013-08-10 ENCOUNTER — Telehealth: Payer: Self-pay | Admitting: *Deleted

## 2013-08-10 NOTE — Telephone Encounter (Signed)
Patient was at [facility] today for preoperative workup. She is scheduled for shoulder surgery next week. She appeared very sleepy at check-in and sedated while waiting in the lobby. She became unresponsive in the lobby and was taken to the ER where she was given Narcan. The anesthesiologist wanted to make [place] aware since we are her PCP. It is uncertain whether her surgery next week will proceed as planned. I will request ER records.

## 2013-08-13 NOTE — Telephone Encounter (Signed)
Records requested from [facility] for review.

## 2013-08-16 ENCOUNTER — Telehealth: Payer: Self-pay | Admitting: Family Medicine

## 2013-08-16 NOTE — Telephone Encounter (Signed)
Appt scheduled per patient request.

## 2013-08-19 ENCOUNTER — Encounter: Payer: Self-pay | Admitting: Family Medicine

## 2013-08-19 ENCOUNTER — Ambulatory Visit (INDEPENDENT_AMBULATORY_CARE_PROVIDER_SITE_OTHER): Payer: Medicare Other | Admitting: Family Medicine

## 2013-08-19 VITALS — BP 130/82 | HR 70 | Temp 97.1°F | Ht 68.0 in | Wt 256.4 lb

## 2013-08-19 DIAGNOSIS — J069 Acute upper respiratory infection, unspecified: Secondary | ICD-10-CM

## 2013-08-19 DIAGNOSIS — B370 Candidal stomatitis: Secondary | ICD-10-CM

## 2013-08-19 MED ORDER — AZITHROMYCIN 250 MG PO TABS: ORAL_TABLET | ORAL | Status: DC

## 2013-08-19 MED ORDER — NYSTATIN 100000 UNIT/ML MT SUSP: 5.0000 mL | Freq: Four times a day (QID) | OROMUCOSAL | Status: DC

## 2013-08-19 NOTE — Progress Notes (Signed)
   Subjective:    Patient ID: Amy Price, female    DOB: 11/12/1965, 48 y.o.   MRN: [id number]  HPI Hospital follow up for OD due to pain meds. She was taking opana and oxycodone and she was found to be unresponsive at [facility] in the registration for xrays and she was taken to ED and given Narcan.  She has ben taking the oxycondone qid as rx'd and the opana was taken away in the ED.  She sees behavioral health for psychiatric illness and pain management for chronic pain.  She is a smoker and she c/o cough and it is productive and green.   Review of Systems No chest pain, SOB, HA, dizziness, vision change, N/V, diarrhea, constipation, dysuria, urinary urgency or frequency, myalgias, arthralgias or rash.     Objective:   Physical Exam Vital signs noted  Well developed well nourished female.  HEENT - Head atraumatic Normocephalic                Eyes - PERRLA, Conjuctiva - clear Sclera- Clear EOMI                Ears - EAC's Wnl TM's Wnl Gross Hearing WNL                Throat - oropharanx wnl Respiratory - Lungs CTA bilateral Cardiac - RRR S1 and S2 without murmur GI - Abdomen soft Nontender and bowel sounds active x 4 Extremities - No edema. Neuro - Grossly intact.       Assessment & Plan:  Thrush - Plan: nystatin (MYCOSTATIN) 100000 UNIT/ML suspension  URI (upper respiratory infection) - Plan: azithromycin (ZITHROMAX) 250 MG tablet  OD - Stop opana and follow up with pain management.  She admits she made mistake and took too much and that is why she OD'd and c/o adequate pain control with out it  Psychiatric illness - Follow up with behavioral health.  Deatra CanterWilliam J Cleon Thoma FNP

## 2013-09-06 ENCOUNTER — Other Ambulatory Visit: Payer: Self-pay | Admitting: Family Medicine

## 2013-09-06 ENCOUNTER — Other Ambulatory Visit (HOSPITAL_COMMUNITY): Payer: Self-pay | Admitting: Psychiatry

## 2013-09-07 ENCOUNTER — Institutional Professional Consult (permissible substitution): Payer: Self-pay | Admitting: Internal Medicine

## 2013-09-07 ENCOUNTER — Ambulatory Visit (INDEPENDENT_AMBULATORY_CARE_PROVIDER_SITE_OTHER): Payer: Self-pay | Admitting: Internal Medicine

## 2013-09-08 ENCOUNTER — Other Ambulatory Visit: Payer: Self-pay | Admitting: Family Medicine

## 2013-09-17 ENCOUNTER — Ambulatory Visit (INDEPENDENT_AMBULATORY_CARE_PROVIDER_SITE_OTHER): Payer: Medicare Other | Admitting: Family Medicine

## 2013-09-17 ENCOUNTER — Encounter: Payer: Self-pay | Admitting: Family Medicine

## 2013-09-17 ENCOUNTER — Ambulatory Visit (INDEPENDENT_AMBULATORY_CARE_PROVIDER_SITE_OTHER): Payer: Medicare Other

## 2013-09-17 VITALS — BP 121/77 | HR 104 | Temp 98.2°F | Ht 68.0 in | Wt 265.0 lb

## 2013-09-17 DIAGNOSIS — J189 Pneumonia, unspecified organism: Secondary | ICD-10-CM

## 2013-09-17 DIAGNOSIS — J441 Chronic obstructive pulmonary disease with (acute) exacerbation: Secondary | ICD-10-CM

## 2013-09-17 MED ORDER — LEVOFLOXACIN 500 MG PO TABS: 500.0000 mg | ORAL_TABLET | Freq: Every day | ORAL | Status: DC

## 2013-09-17 MED ORDER — IPRATROPIUM-ALBUTEROL 20-100 MCG/ACT IN AERS: 1.0000 | INHALATION_SPRAY | Freq: Four times a day (QID) | RESPIRATORY_TRACT | Status: AC

## 2013-09-17 MED ORDER — ALBUTEROL SULFATE (2.5 MG/3ML) 0.083% IN NEBU: 2.5000 mg | INHALATION_SOLUTION | Freq: Four times a day (QID) | RESPIRATORY_TRACT | Status: AC | PRN

## 2013-09-17 NOTE — Progress Notes (Signed)
   Subjective:    Patient ID: Amy Price, female    DOB: 04/11/1965, 48 y.o.   MRN: [id number]  HPI  Patient is here for follow up.  She was going to get right shoulder surgery at [facility] and in her pre-operative work up she was found to have pneumonia with hypoxic respiratory failure.  She has hx of Copd.  She was tx'd with levaquin, IV steroids, and nebulizer tx's.  She has hx of bipolar disorder and tobacco abuse.  She states she gets SOB walking across room.  She c/o fever.   Review of Systems C/o SOB No chest pain, HA, dizziness, vision change, N/V, diarrhea, constipation, dysuria, urinary urgency or frequency, myalgias, arthralgias or rash.     Objective:   Physical Exam  Vital signs noted  Well developed well nourished obese female.  HEENT - Head atraumatic Normocephalic                Eyes - PERRLA, Conjuctiva - clear Sclera- Clear EOMI                Ears - EAC's Wnl TM's Wnl Gross Hearing WNL                Throat - oropharanx wnl Respiratory - Lungs CTA bilateral Cardiac - RRR S1 and S2 without murmur   CXR - No infiltrates noted Prelimnary reading by Chrissie Noa Revere Maahs,FNP    Assessment & Plan:  Pneumonia, organism unspecified - Plan: levofloxacin (LEVAQUIN) 500 MG tablet, DG Chest 2 View, POCT CBC, albuterol (PROVENTIL) (2.5 MG/3ML) 0.083% nebulizer solution, Ipratropium-Albuterol (COMBIVENT RESPIMAT) 20-100 MCG/ACT AERS respimat  COPD exacerbation - Plan: levofloxacin (LEVAQUIN) 500 MG tablet, DG Chest 2 View, POCT CBC, albuterol (PROVENTIL) (2.5 MG/3ML) 0.083% nebulizer solution, Ipratropium-Albuterol (COMBIVENT RESPIMAT) 20-100 MCG/ACT AERS respimat.  Tobacco abuse - Discussed with patient that she needs to quit smoking.  Right shoulder pain and impingement - Follow up with Orthopedics  Deatra Canter FNP

## 2013-09-21 ENCOUNTER — Ambulatory Visit (INDEPENDENT_AMBULATORY_CARE_PROVIDER_SITE_OTHER): Payer: Self-pay | Admitting: Internal Medicine

## 2013-09-22 ENCOUNTER — Encounter: Payer: Self-pay | Admitting: *Deleted

## 2013-09-30 ENCOUNTER — Ambulatory Visit (HOSPITAL_COMMUNITY): Payer: Self-pay | Admitting: Psychiatry

## 2013-10-12 ENCOUNTER — Telehealth (HOSPITAL_COMMUNITY): Payer: Self-pay | Admitting: *Deleted

## 2013-10-12 NOTE — Telephone Encounter (Signed)
noted 

## 2013-10-13 ENCOUNTER — Ambulatory Visit (HOSPITAL_COMMUNITY): Payer: Self-pay | Admitting: Psychiatry

## 2013-10-14 ENCOUNTER — Ambulatory Visit (INDEPENDENT_AMBULATORY_CARE_PROVIDER_SITE_OTHER): Payer: Medicare Other | Admitting: Internal Medicine

## 2013-10-14 ENCOUNTER — Encounter: Payer: Self-pay | Admitting: Internal Medicine

## 2013-10-14 VITALS — BP 132/80 | HR 93 | Ht 67.0 in | Wt 260.4 lb

## 2013-10-14 DIAGNOSIS — Z720 Tobacco use: Secondary | ICD-10-CM

## 2013-10-14 DIAGNOSIS — Z23 Encounter for immunization: Secondary | ICD-10-CM

## 2013-10-14 DIAGNOSIS — G4730 Sleep apnea, unspecified: Secondary | ICD-10-CM | POA: Insufficient documentation

## 2013-10-14 DIAGNOSIS — K219 Gastro-esophageal reflux disease without esophagitis: Secondary | ICD-10-CM | POA: Insufficient documentation

## 2013-10-14 DIAGNOSIS — F17200 Nicotine dependence, unspecified, uncomplicated: Secondary | ICD-10-CM

## 2013-10-14 DIAGNOSIS — J189 Pneumonia, unspecified organism: Secondary | ICD-10-CM | POA: Insufficient documentation

## 2013-10-14 DIAGNOSIS — G478 Other sleep disorders: Secondary | ICD-10-CM

## 2013-10-14 NOTE — Assessment & Plan Note (Signed)
Body habitus would be consistent with OSA, but she says to previous studies were negative. Obesity, long palate and narcotic pain medications put her at risk. Plan-get reports of previous sleep studies. Encourage sleep off flat of back

## 2013-10-14 NOTE — Assessment & Plan Note (Signed)
Smoking cessation counseling begun

## 2013-10-14 NOTE — Assessment & Plan Note (Signed)
Plan-requesting discharge summaries. CT scan disc reviewed. High probability aspiration pneumonia in the right lung. Likely underlying chronic bronchitis from smoking. We will get PFTs after further clearing

## 2013-10-14 NOTE — Assessment & Plan Note (Signed)
High-risk for aspiration. Plan-reflux precautions, elevate head of bed, etc.

## 2013-10-14 NOTE — Patient Instructions (Addendum)
It will be important to prevent reflux and aspiration of stomach juice while you sleep-   Prop up the head end of your bed by putting one brick under each head leg.   Don't eat for 1 hour before lying down  Please do everything you can to stop smoking. The otc nicotine patches can help  Use the breathing medicine you have for now. We will look at what do do later, as you finish getting over this pneumonia  Flu vax  Pneumovax-23  Permit for release of records so we can get the medical discharge summaries from your hospital stays at [facility]

## 2013-10-14 NOTE — Progress Notes (Signed)
10/14/13- 8 yoF smoker referred courtesy of Dr Purcell [facility]; Insomnia and Pneumonia-recent stay in [facility] for PNA Hx PTSD, BiPolar, HTN, Crohn's. Mother and brother here Currently smoking 1/4 PPD, usual has been one pack per day. Hospitalized at [facility] August 21 and again September 22 with R lung CAP and hypoxemia- no organism per current available records.  Brings disc of CT chest dated 10/10/2013 showing diffuse right lung pneumonia, clear on the left with some reactive adenopathy. She is finishing Augmentin and prednisone taper, slowly improving with residual cough, clear sputum, no fever, no blood.  History of "bad" GERD. She is currently taking proton X. x2 at bedtime, trying to sleep on a wedge on her sofa. Admits she does "strangle a lot" at night. Reports swallowing evaluation was normal. Chronic pain medication for back pain and multiple arthritic pains. It was also intended that she be seen here for question of sleep disordered breathing. She does snore some and brother says that she holds her breath occasionally. She reports NPSG x 2 at [facility] negative. Bedtime between 7 and 8 PM, sleep latency one hour, waking 3 or 4 times CXR 09/17/13 IMPRESSION:  Decreased right middle lobe airspace disease/pneumonia.  Electronically Signed  By: Laveda Abbe M.D.  On: 09/17/2013 10:49 CT chest 10/10/2013/[place]-diffuse right lung pneumonia, mild reactive right hilar and mediastinal adenopathy, clear left lung.  Prior to Admission medications   Medication Sig Start Date End Date Taking? Authorizing Provider  albuterol (PROVENTIL) (2.5 MG/3ML) 0.083% nebulizer solution Take 3 mLs (2.5 mg total) by nebulization every 6 (six) hours as needed for wheezing or shortness of breath. 09/17/13  Yes Deatra Canter, FNP  ALPRAZolam Prudy Feeler) 1 MG tablet Take one four times a day 06/30/13  Yes Diannia Ruder, MD  amitriptyline (ELAVIL) 50 MG tablet Take 1 tablet (50 mg total) by mouth at bedtime.  06/30/13  Yes Diannia Ruder, MD  amoxicillin-clavulanate (AUGMENTIN) 875-125 MG per tablet Take 1 tablet by mouth daily. 10/12/13  Yes Historical Provider, MD  aspirin 81 MG tablet Take 81 mg by mouth daily.   Yes Historical Provider, MD  baclofen (LIORESAL) 10 MG tablet Take 1 tablet (10 mg total) by mouth 3 (three) times daily. 04/19/13  Yes Deatra Canter, FNP  beclomethasone (QVAR) 80 MCG/ACT inhaler Inhale 1 puff into the lungs 2 (two) times daily. 04/19/13  Yes Deatra Canter, FNP  benztropine (COGENTIN) 1 MG tablet Take 1 tablet (1 mg total) by mouth daily. 06/30/13 06/30/14 Yes Diannia Ruder, MD  chlorproMAZINE (THORAZINE) 100 MG tablet Take 2 tablets (200 mg total) by mouth at bedtime. 06/30/13  Yes Diannia Ruder, MD  cyanocobalamin (,VITAMIN B-12,) 1000 MCG/ML injection Inject one ml IM daily for a week then weekly for 4 weeks then monthly 07/26/13  Yes Deatra Canter, FNP  dicyclomine (BENTYL) 10 MG capsule Take 1 capsule (10 mg total) by mouth 3 (three) times daily before meals. 03/10/13  Yes Malissa Hippo, MD  DULoxetine (CYMBALTA) 60 MG capsule Take 1 capsule (60 mg total) by mouth daily. 01/18/13  Yes Deatra Canter, FNP  fluticasone (FLONASE) 50 MCG/ACT nasal spray Place 2 sprays into both nostrils daily. 04/19/13  Yes Deatra Canter, FNP  HORIZANT 600 MG TBCR  08/10/13  Yes Historical Provider, MD  hydrochlorothiazide (MICROZIDE) 12.5 MG capsule Take 1 capsule (12.5 mg total) by mouth daily. 01/18/13  Yes Deatra Canter, FNP  ibuprofen (ADVIL,MOTRIN) 800 MG tablet TAKE 1 TABLET BY MOUTH AS NEEDED.  09/09/13  Yes Deatra Canter, FNP  Ipratropium-Albuterol (COMBIVENT RESPIMAT) 20-100 MCG/ACT AERS respimat Inhale 1 puff into the lungs every 6 (six) hours. 09/17/13  Yes Deatra Canter, FNP  metoprolol succinate (TOPROL-XL) 25 MG 24 hr tablet Take 1 tablet (25 mg total) by mouth daily. 01/18/13  Yes Deatra Canter, FNP  norethindrone (AYGESTIN) 5 MG tablet Take 5 mg by mouth daily.   09/16/12  Yes Historical Provider, MD  nystatin (MYCOSTATIN) 100000 UNIT/ML suspension Take 5 mLs (500,000 Units total) by mouth 4 (four) times daily. 08/19/13  Yes Deatra Canter, FNP  OPANA ER, CRUSH RESISTANT, 20 MG T12A Take 1 tablet by mouth 2 times daily at 12 noon and 4 pm.  07/07/13  Yes Historical Provider, MD  oxyCODONE (ROXICODONE) 15 MG immediate release tablet  08/13/13  Yes Historical Provider, MD  oxyCODONE-acetaminophen (PERCOCET) 7.5-325 MG per tablet Take 1 tablet by mouth every 6 (six) hours as needed.  07/07/13  Yes Historical Provider, MD  pantoprazole (PROTONIX) 40 MG tablet TAKE 1 TABLET BY MOUTH TWICE DAILY 08/05/13  Yes Malissa Hippo, MD  potassium chloride (K-DUR,KLOR-CON) 10 MEQ tablet Take 1 tablet by mouth daily. 10/12/13  Yes Historical Provider, MD  predniSONE (DELTASONE) 20 MG tablet Take 1 tablet by mouth 2 (two) times daily. 10/12/13  Yes Historical Provider, MD  topiramate (TOPAMAX) 25 MG tablet Take 2 tablets (50 mg total) by mouth daily. 01/18/13  Yes Deatra Canter, FNP  traZODone (DESYREL) 50 MG tablet Take 1 tablet (50 mg total) by mouth at bedtime. 06/30/13  Yes Diannia Ruder, MD  Vitamin D, Ergocalciferol, (DRISDOL) 50000 UNITS CAPS capsule Take 1 capsule (50,000 Units total) by mouth every 7 (seven) days. 07/26/13  Yes Deatra Canter, FNP  ketoconazole (NIZORAL) 2 % cream Apply 1 application topically daily. 08/09/13   Deatra Canter, FNP   Past Medical History  Diagnosis Date  . Insomnia   . Crohn's disease   . Bipolar 1 disorder   . Migraine   . Depression   . Anxiety   . GERD (gastroesophageal reflux disease)   . Hypertension   . Asthma    Past Surgical History  Procedure Laterality Date  . Back surgery    . Knee surgery    . Abdominal hysterectomy    . Tubal ligation    . Back surgery      stimulator removed  . Shoulder surgery    . Colonoscopy N/A 03/10/2013    Procedure: COLONOSCOPY;  Surgeon: Malissa Hippo, MD;  Location: [facility];  Service: Endoscopy;  Laterality: N/A;  145   Family History  Problem Relation Age of Onset  . Diabetes Mother   . Arthritis Sister   . Osteoporosis Sister   . Depression Sister   . Alcohol abuse Sister   . Diabetes Brother   . Depression Brother   . Colitis Son   . Healthy Son   . Alcohol abuse Father    History   Social History  . Marital Status: Legally Separated    Spouse Name: N/A    Number of Children: N/A  . Years of Education: N/A   Occupational History  . Not on file.   Social History Main Topics  . Smoking status: Current Every Day Smoker -- 0.25 packs/day for 33 years    Types: Cigarettes  . Smokeless tobacco: Never Used     Comment: less than a 1/2 pack a day or less x  30 yrs  . Alcohol Use: No  . Drug Use: No  . Sexual Activity: No   Other Topics Concern  . Not on file   Social History Narrative  . No narrative on file   ROS-see HPI Constitutional:   No-   weight loss, night sweats, fevers, chills, fatigue, lassitude. HEENT:   No-  headaches, difficulty swallowing, tooth/dental problems, sore throat,       No-  sneezing, itching, ear ache, nasal congestion, post nasal drip,  CV:  No-   chest pain, orthopnea, PND, swelling in lower extremities, anasarca,                                  dizziness, palpitations Resp: No-   shortness of breath with exertion or at rest.              No-   productive cough,  No non-productive cough,  No- coughing up of blood.              No-   change in color of mucus.  No- wheezing.   Skin: No-   rash or lesions. GI:  No-   heartburn, indigestion, abdominal pain, nausea, vomiting, diarrhea,                 change in bowel habits, loss of appetite GU: No-   dysuria, change in color of urine, no urgency or frequency.  No- flank pain. MS:  No-   joint pain or swelling.  No- decreased range of motion.  No- back pain. Neuro-     nothing unusual Psych:  No- change in mood or affect. No depression or anxiety.  No  memory loss.  OBJ- Physical Exam General- Alert, Oriented, Affect-appropriate, Distress- none acute, + obese Skin- rash-none, lesions- none, excoriation- none, + tattoos Lymphadenopathy- none Head- atraumatic            Eyes- Gross vision intact, PERRLA, conjunctivae and secretions clear            Ears- Hearing, canals-normal            Nose- Clear, no-Septal dev, mucus, polyps, erosion, perforation             Throat- Mallampati II , mucosa clear , drainage- none, tonsils- atrophic, + edentulous Neck- flexible , trachea midline, no stridor , thyroid nl, carotid no bruit Chest - symmetrical excursion , unlabored           Heart/CV- RRR , no murmur , no gallop  , no rub, nl s1 s2                           - JVD- none , edema- none, stasis changes- none, varices- none           Lung- clear to P&A, wheeze- none, cough+ with deep breath , dullness-none, rub- none           Chest wall-  Abd- tender-no, distended-no, bowel sounds-present, HSM- no Br/ Gen/ Rectal- Not done, not indicated Extrem- cyanosis- none, clubbing, none, atrophy- none, strength- nl Neuro- grossly intact to observation

## 2013-10-15 ENCOUNTER — Institutional Professional Consult (permissible substitution): Payer: Self-pay | Admitting: Internal Medicine

## 2013-10-15 ENCOUNTER — Encounter: Payer: Self-pay | Admitting: Family Medicine

## 2013-10-15 ENCOUNTER — Ambulatory Visit (INDEPENDENT_AMBULATORY_CARE_PROVIDER_SITE_OTHER): Payer: Medicare Other | Admitting: Family Medicine

## 2013-10-15 VITALS — BP 126/87 | HR 114 | Temp 97.5°F | Ht 67.0 in | Wt 262.0 lb

## 2013-10-15 DIAGNOSIS — K2100 Gastro-esophageal reflux disease with esophagitis, without bleeding: Secondary | ICD-10-CM

## 2013-10-15 DIAGNOSIS — J690 Pneumonitis due to inhalation of food and vomit: Secondary | ICD-10-CM

## 2013-10-15 MED ORDER — PREDNISONE 10 MG PO TABS: ORAL_TABLET | ORAL | Status: DC

## 2013-10-15 MED ORDER — RANITIDINE HCL 150 MG PO TABS: 150.0000 mg | ORAL_TABLET | Freq: Two times a day (BID) | ORAL | Status: AC

## 2013-10-15 MED ORDER — AMOXICILLIN-POT CLAVULANATE 875-125 MG PO TABS: 1.0000 | ORAL_TABLET | Freq: Two times a day (BID) | ORAL | Status: DC

## 2013-10-15 NOTE — Progress Notes (Signed)
   Subjective:    Patient ID: Amy Price, female    DOB: Jul 22, 1965, 48 y.o.   MRN: [id number]  HPI Patient is here for hospital follow up for admission to [facility] for right lung pneumonia.  She was hospitalized 9/20 - 9/22/215 and tx'd with IV abx's rocephin and zithromax.  She has been DC'd home on abx's augmentin and prednisone taper.  She is feeling bettr.  She saw Pulmonary Dr. Jetty Duhamel who is following her COPD and suspected OSA. She continues to smoke.  She has hx of psychiatric illness.  She did see pulmonary in the hospital who dx her with aspiration pneumonia and pneumonitis. She She did undergo a swallow evaluation w/o improvement.  She was in the hospital recently with the same problem right lung pneumonia.  She is on  of prednisone a day And has no taper.  She was dc'd home on augmentin for a few days.   Review of Systems C/o SOB   No chest pain, SOB, HA, dizziness, vision change, N/V, diarrhea, constipation, dysuria, urinary urgency or frequency, myalgias, arthralgias or rash.  Objective:   Physical Exam Vital signs noted  Well developed well nourished obese female.  HEENT - Head atraumatic Normocephalic                Eyes - PERRLA, Conjuctiva - clear Sclera- Clear EOMI                Ears - EAC's Wnl TM's Wnl Gross Hearing WNL                Nose - Nares patent                 Throat - oropharanx wnl Respiratory - Lungs right chest with coarse BS and left chest clear Cardiac - RRR S1 and S2 without murmur GI - Abdomen soft Nontender and bowel sounds active x 4 Extremities - No edema. Neuro - Grossly intact.       Assessment & Plan:  Gastroesophageal reflux disease with esophagitis - Plan: ranitidine (ZANTAC) 150 MG tablet  Aspiration pneumonia, unspecified aspiration pneumonia type - Plan: predniSONE (DELTASONE) 10 MG tablet, amoxicillin-clavulanate (AUGMENTIN) 875-125 MG per tablet  Deatra Canter FNP

## 2013-10-18 ENCOUNTER — Ambulatory Visit (INDEPENDENT_AMBULATORY_CARE_PROVIDER_SITE_OTHER): Payer: Self-pay | Admitting: Internal Medicine

## 2013-10-19 ENCOUNTER — Ambulatory Visit (INDEPENDENT_AMBULATORY_CARE_PROVIDER_SITE_OTHER): Payer: Medicare Other | Admitting: Internal Medicine

## 2013-10-19 ENCOUNTER — Ambulatory Visit (HOSPITAL_COMMUNITY): Payer: Self-pay | Admitting: Psychiatry

## 2013-10-19 ENCOUNTER — Encounter (INDEPENDENT_AMBULATORY_CARE_PROVIDER_SITE_OTHER): Payer: Self-pay | Admitting: Internal Medicine

## 2013-10-19 VITALS — BP 112/82 | HR 84 | Temp 98.0°F | Ht 67.0 in | Wt 261.2 lb

## 2013-10-19 DIAGNOSIS — K5090 Crohn's disease, unspecified, without complications: Secondary | ICD-10-CM

## 2013-10-19 NOTE — Patient Instructions (Signed)
OV in 1 yr. If any problems call our office 

## 2013-10-19 NOTE — Progress Notes (Addendum)
Subjective:    Patient ID: Amy MainsBonnie W Lengel, female    DOB: 10/18/1965, 48 y.o.   MRN: [id number]  HPI Here today for f/u. Recently underwent a colonoscopy in February of this year (see below).  She tells me she is doing fine. She does occasionally become constipated and she will use Miralax which helps.  [facility] x 2 in the past 2 months.. Admitted x 3 days. Presently taking a Prednisone taper for the pneumonia.  Appetite is not good. She is forcing herself to eat.  She denies rectal bleeding.  She his having a BM about 3-4 times a day. Stools are formed. She does not take Pentasa because she says the pebbles pass straight thru.     Hx of small bowel Crohn disease diagnosed in 1991 and she had a rt hemicolectomy in November 1995.  Hx Her [facility]. She had normal Colonic mucosa. She had normal ileocolonic anastomosis.  EGD in April 2009 revealed Mallory-Weiss tear, nonerosive antral gastritis and H. Pylori was negative.  Gastric emptying study in May of 2009 revealed T 1/2 of 95 minutes which is felt be upper limit of normal.  She has hx of chronic GERD. She has chronic low back pain.        03/10/2013 Colonoscopy: Crohn's disease, intermittent hemechezia: Impression:  Normal mucosa of distal small bowel.  Patent ileocolonic anastomosis.  Normal colonoscopy except external hemorrhoids.  Comment;  Crohn's disease and appears to be in remission and she may have IBS.  If symptoms do not respond to dicyclomine will consider small bowel follow-through.  Recommendations:  Standard instructions given.  Dicyclomine 10 mg by mouth 3 times a day.  Office visit in 6 months.  09/10/2013 H and H 14.5 and 44.3  Review of Systems Past Medical History  Diagnosis Date  . Insomnia   . Crohn's disease   . Bipolar 1 disorder   . Migraine   . Depression   . Anxiety   . GERD (gastroesophageal reflux disease)   . Hypertension     . Asthma     Past Surgical History  Procedure Laterality Date  . Back surgery    . Knee surgery    . Abdominal hysterectomy    . Tubal ligation    . Back surgery      stimulator removed  . Shoulder surgery    . Colonoscopy N/A 03/10/2013    Procedure: COLONOSCOPY;  Surgeon: Malissa HippoNajeeb U [name], MD;  Location: [facility];  Service: Endoscopy;  Laterality: N/A;  145    Allergies  Allergen Reactions  . Aspirin Nausea And Vomiting    FULL DOSE 325mg   . Darvocet [Propoxyphene N-Acetaminophen] Nausea And Vomiting  . Imitrex [Sumatriptan Base] Nausea And Vomiting  . Tramadol Nausea And Vomiting  . Toradol [Ketorolac Tromethamine] Other (See Comments)    Makes skin burn    Current Outpatient Prescriptions on File Prior to Visit  Medication Sig Dispense Refill  . albuterol (PROVENTIL) (2.5 MG/3ML) 0.083% nebulizer solution Take 3 mLs (2.5 mg total) by nebulization every 6 (six) hours as needed for wheezing or shortness of breath.  150 mL  3  . ALPRAZolam (XANAX) 1 MG tablet Take one four times a day  120 tablet  3  . amitriptyline (ELAVIL) 50 MG tablet Take 1 tablet (50 mg total) by mouth at bedtime.  30 tablet  2  . amoxicillin-clavulanate (AUGMENTIN) 875-125 MG per tablet  Take 1 tablet by mouth 2 (two) times daily.  28 tablet  0  . aspirin 81 MG tablet Take 81 mg by mouth daily.      . baclofen (LIORESAL) 10 MG tablet Take 1 tablet (10 mg total) by mouth 3 (three) times daily.  30 each  0  . beclomethasone (QVAR) 80 MCG/ACT inhaler Inhale 1 puff into the lungs 2 (two) times daily.  1 Inhaler  3  . benztropine (COGENTIN) 1 MG tablet Take 1 tablet (1 mg total) by mouth daily.  30 tablet  2  . chlorproMAZINE (THORAZINE) 100 MG tablet Take 2 tablets (200 mg total) by mouth at bedtime.  60 tablet  2  . cyanocobalamin (,VITAMIN B-12,) 1000 MCG/ML injection Inject one ml IM daily for a week then weekly for 4 weeks then monthly  10 mL  3  . dicyclomine (BENTYL) 10 MG capsule Take 1 capsule  (10 mg total) by mouth 3 (three) times daily before meals.  90 capsule  5  . DULoxetine (CYMBALTA) 60 MG capsule Take 1 capsule (60 mg total) by mouth daily.  30 capsule  11  . fluticasone (FLONASE) 50 MCG/ACT nasal spray Place 2 sprays into both nostrils daily.  16 g  11  . HORIZANT 600 MG TBCR       . hydrochlorothiazide (MICROZIDE) 12.5 MG capsule Take 1 capsule (12.5 mg total) by mouth daily.  30 capsule  11  . ibuprofen (ADVIL,MOTRIN) 800 MG tablet TAKE 1 TABLET BY MOUTH AS NEEDED.  30 tablet  2  . Ipratropium-Albuterol (COMBIVENT RESPIMAT) 20-100 MCG/ACT AERS respimat Inhale 1 puff into the lungs every 6 (six) hours.  1 Inhaler  11  . ketoconazole (NIZORAL) 2 % cream Apply 1 application topically daily.  30 g  3  . metoprolol succinate (TOPROL-XL) 25 MG 24 hr tablet Take 1 tablet (25 mg total) by mouth daily.  30 tablet  11  . norethindrone (AYGESTIN) 5 MG tablet Take 5 mg by mouth daily.       Marland Kitchen nystatin (MYCOSTATIN) 100000 UNIT/ML suspension Take 5 mLs (500,000 Units total) by mouth 4 (four) times daily.  240 mL  1  . OPANA ER, CRUSH RESISTANT, 20 MG T12A Take 1 tablet by mouth 2 times daily at 12 noon and 4 pm.       . oxyCODONE (ROXICODONE) 15 MG immediate release tablet       . oxyCODONE-acetaminophen (PERCOCET) 7.5-325 MG per tablet Take 1 tablet by mouth every 6 (six) hours as needed.       . pantoprazole (PROTONIX) 40 MG tablet TAKE 1 TABLET BY MOUTH TWICE DAILY  60 tablet  5  . potassium chloride (K-DUR,KLOR-CON) 10 MEQ tablet Take 1 tablet by mouth daily.      . predniSONE (DELTASONE) 10 MG tablet Take 3 po qd x 3 days then 2 po qd x 3 days then 1 po qd x 3 days then stop  18 tablet  0  . predniSONE (DELTASONE) 20 MG tablet Take 1 tablet by mouth 2 (two) times daily.      . ranitidine (ZANTAC) 150 MG tablet Take 1 tablet (150 mg total) by mouth 2 (two) times daily.  60 tablet  5  . topiramate (TOPAMAX) 25 MG tablet Take 2 tablets (50 mg total) by mouth daily.  30 tablet  11  .  traZODone (DESYREL) 50 MG tablet Take 1 tablet (50 mg total) by mouth at bedtime.  30 tablet  2  .  Vitamin D, Ergocalciferol, (DRISDOL) 50000 UNITS CAPS capsule Take 1 capsule (50,000 Units total) by mouth every 7 (seven) days.  18 capsule  0   No current facility-administered medications on file prior to visit.        Objective:   Physical Exam  Filed Vitals:   10/19/13 1033  BP: 112/82  Pulse: 84  Temp: 98 F (36.7 C)  Height: 5\' 7"  (1.702 m)  Weight: 261 lb 3.2 oz (118.48 kg)   Alert and oriented. Skin warm and dry. Oral mucosa is moist.   . Sclera anicteric, conjunctivae is pink. Thyroid not enlarged. No cervical lymphadenopathy. Lungs clear. Heart regular rate and rhythm.  Abdomen is soft. Bowel sounds are positive. No hepatomegaly. No abdominal masses felt. No tenderness.  No edema to lower extremities. Patient is obese.         Assessment & Plan:   Crohn's disease. She seems to be in remission. Just getting over pneumonia. Presently taking a Prednisone taper for pneumonia.    Has been off Pentasa for several months. She will start taking Pentasa 500mg  . Two in am and two in pm. OV in 1 yr. If any problems call our office. CBC,. CRP today.

## 2013-10-20 ENCOUNTER — Encounter (INDEPENDENT_AMBULATORY_CARE_PROVIDER_SITE_OTHER): Payer: Self-pay | Admitting: Internal Medicine

## 2013-10-20 DIAGNOSIS — J189 Pneumonia, unspecified organism: Secondary | ICD-10-CM | POA: Insufficient documentation

## 2013-10-20 LAB — CBC WITH DIFFERENTIAL/PLATELET
BASOS ABS: 0 10*3/uL (ref 0.0–0.1)
BASOS PCT: 0 % (ref 0–1)
Eosinophils Absolute: 0 10*3/uL (ref 0.0–0.7)
Eosinophils Relative: 0 % (ref 0–5)
HCT: 45.3 % (ref 36.0–46.0)
Hemoglobin: 15.2 g/dL — ABNORMAL HIGH (ref 12.0–15.0)
Lymphocytes Relative: 22 % (ref 12–46)
Lymphs Abs: 4.5 10*3/uL — ABNORMAL HIGH (ref 0.7–4.0)
MCH: 29.7 pg (ref 26.0–34.0)
MCHC: 33.6 g/dL (ref 30.0–36.0)
MCV: 88.6 fL (ref 78.0–100.0)
MONOS PCT: 8 % (ref 3–12)
Monocytes Absolute: 1.6 10*3/uL — ABNORMAL HIGH (ref 0.1–1.0)
NEUTROS ABS: 14.4 10*3/uL — AB (ref 1.7–7.7)
NEUTROS PCT: 70 % (ref 43–77)
Platelets: 389 10*3/uL (ref 150–400)
RBC: 5.11 MIL/uL (ref 3.87–5.11)
RDW: 14 % (ref 11.5–15.5)
WBC: 20.6 10*3/uL — ABNORMAL HIGH (ref 4.0–10.5)

## 2013-10-20 LAB — C-REACTIVE PROTEIN

## 2013-10-21 ENCOUNTER — Telehealth (INDEPENDENT_AMBULATORY_CARE_PROVIDER_SITE_OTHER): Payer: Self-pay | Admitting: *Deleted

## 2013-10-21 ENCOUNTER — Other Ambulatory Visit (INDEPENDENT_AMBULATORY_CARE_PROVIDER_SITE_OTHER): Payer: Self-pay | Admitting: *Deleted

## 2013-10-21 ENCOUNTER — Encounter (INDEPENDENT_AMBULATORY_CARE_PROVIDER_SITE_OTHER): Payer: Self-pay | Admitting: *Deleted

## 2013-10-21 DIAGNOSIS — K5090 Crohn's disease, unspecified, without complications: Secondary | ICD-10-CM

## 2013-10-21 DIAGNOSIS — D72829 Elevated white blood cell count, unspecified: Secondary | ICD-10-CM

## 2013-10-21 NOTE — Telephone Encounter (Signed)
.  Per Amy Price patient will have labs drawn in 4 weeks.

## 2013-10-22 ENCOUNTER — Ambulatory Visit (HOSPITAL_COMMUNITY): Payer: Self-pay | Admitting: Psychiatry

## 2013-10-22 ENCOUNTER — Encounter (HOSPITAL_COMMUNITY): Payer: Self-pay | Admitting: Psychiatry

## 2013-10-29 ENCOUNTER — Ambulatory Visit (INDEPENDENT_AMBULATORY_CARE_PROVIDER_SITE_OTHER): Payer: Self-pay | Admitting: Psychiatry

## 2013-10-29 ENCOUNTER — Encounter (HOSPITAL_COMMUNITY): Payer: Self-pay | Admitting: Psychiatry

## 2013-10-29 VITALS — BP 130/82 | Ht 67.0 in | Wt 263.0 lb

## 2013-10-29 DIAGNOSIS — G894 Chronic pain syndrome: Secondary | ICD-10-CM

## 2013-10-29 DIAGNOSIS — F411 Generalized anxiety disorder: Secondary | ICD-10-CM

## 2013-10-29 DIAGNOSIS — F4310 Post-traumatic stress disorder, unspecified: Secondary | ICD-10-CM

## 2013-10-29 MED ORDER — ALPRAZOLAM 1 MG PO TABS: ORAL_TABLET | ORAL | Status: AC

## 2013-10-29 MED ORDER — CHLORPROMAZINE HCL 100 MG PO TABS: 200.0000 mg | ORAL_TABLET | Freq: Every day | ORAL | Status: AC

## 2013-10-29 MED ORDER — BENZTROPINE MESYLATE 1 MG PO TABS: 1.0000 mg | ORAL_TABLET | Freq: Every day | ORAL | Status: AC

## 2013-10-29 MED ORDER — AMITRIPTYLINE HCL 50 MG PO TABS: 50.0000 mg | ORAL_TABLET | Freq: Every day | ORAL | Status: AC

## 2013-10-29 MED ORDER — DULOXETINE HCL 60 MG PO CPEP: 60.0000 mg | ORAL_CAPSULE | Freq: Every day | ORAL | Status: AC

## 2013-10-29 MED ORDER — TRAZODONE HCL 50 MG PO TABS: 50.0000 mg | ORAL_TABLET | Freq: Every day | ORAL | Status: AC
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
## 2013-11-03 ENCOUNTER — Other Ambulatory Visit (HOSPITAL_COMMUNITY): Payer: Self-pay | Admitting: Internal Medicine

## 2013-11-05 ENCOUNTER — Telehealth (HOSPITAL_COMMUNITY): Payer: Self-pay | Admitting: *Deleted

## 2013-11-05 NOTE — Telephone Encounter (Signed)
Pt pharmacy requesting pt Xanax. Called pharmacy and informed them that pt was given a printed script 10-29-13. Pharmacy agreed to wait for pt to call pharmacy or bring in her script.

## 2013-11-09 ENCOUNTER — Other Ambulatory Visit: Payer: Self-pay | Admitting: *Deleted

## 2013-11-09 MED ORDER — VITAMIN D (ERGOCALCIFEROL) 1.25 MG (50000 UNIT) PO CAPS: 50000.0000 [IU] | ORAL_CAPSULE | ORAL | Status: AC

## 2013-11-09 NOTE — Telephone Encounter (Signed)
Last vit d level 07/20/13-29.6.

## 2013-11-10 ENCOUNTER — Encounter: Payer: Self-pay | Admitting: Family Medicine

## 2013-11-10 ENCOUNTER — Ambulatory Visit (INDEPENDENT_AMBULATORY_CARE_PROVIDER_SITE_OTHER): Payer: Medicare Other | Admitting: Family Medicine

## 2013-11-10 VITALS — BP 114/75 | HR 96 | Temp 96.9°F | Ht 68.0 in | Wt 256.8 lb

## 2013-11-10 DIAGNOSIS — J441 Chronic obstructive pulmonary disease with (acute) exacerbation: Secondary | ICD-10-CM

## 2013-11-10 DIAGNOSIS — B372 Candidiasis of skin and nail: Secondary | ICD-10-CM

## 2013-11-10 MED ORDER — KETOCONAZOLE 2 % EX CREA: 1.0000 "application " | TOPICAL_CREAM | Freq: Every day | CUTANEOUS | Status: AC

## 2013-11-10 MED ORDER — BENZONATATE 100 MG PO CAPS: ORAL_CAPSULE | ORAL | Status: DC

## 2013-11-10 MED ORDER — LEVOFLOXACIN 500 MG PO TABS: 500.0000 mg | ORAL_TABLET | Freq: Every day | ORAL | Status: AC

## 2013-11-10 MED ORDER — PREDNISONE 10 MG PO TABS: ORAL_TABLET | ORAL | Status: AC

## 2013-11-10 NOTE — Progress Notes (Signed)
   Subjective:    Patient ID: Amy Price, female    DOB: 1965-09-15, 48 y.o.   MRN: [id number]  HPI No chest pain, SOB, HA, dizziness, vision change, N/V, diarrhea, constipation, dysuria, urinary urgency or frequency, myalgias, arthralgias or rash.    Review of Systems This 48 y.o. female presents for evaluation of routine visit.  She has recently seen Dr. Maple [facility].  She has been having an exacerbation.  She has been SOB and coughing alot.    Objective:   Physical Exam  Vital signs noted  Well developed well nourished female.  HEENT - Head atraumatic Normocephalic                Eyes - PERRLA, Conjuctiva - clear Sclera- Clear EOMI                Ears - EAC's Wnl TM's Wnl Gross Hearing WNL                Nose - Nares patent                 Throat - oropharanx wnl Respiratory - Lungs CTA bilateral Cardiac - RRR S1 and S2 without murmur GI - Abdomen soft Nontender and bowel sounds active x 4 Extremities - No edema. Neuro - Grossly intact.      Assessment & Plan:  Candidal intertrigo - Plan: ketoconazole (NIZORAL) 2 % cream  COPD exacerbation - Plan: predniSONE (DELTASONE) 10 MG tablet, benzonatate (TESSALON PERLES) 100 MG capsule, levofloxacin (LEVAQUIN) 500 MG tablet  Deatra Canter FNP

## 2013-11-11 ENCOUNTER — Ambulatory Visit: Payer: Medicare Other | Admitting: Family Medicine

## 2013-11-16 ENCOUNTER — Institutional Professional Consult (permissible substitution): Payer: Self-pay | Admitting: Internal Medicine

## 2013-11-18 LAB — CBC
HCT: 43.8 % (ref 36.0–46.0)
Hemoglobin: 14.6 g/dL (ref 12.0–15.0)
MCH: 29.1 pg (ref 26.0–34.0)
MCHC: 33.3 g/dL (ref 30.0–36.0)
MCV: 87.3 fL (ref 78.0–100.0)
Platelets: 383 10*3/uL (ref 150–400)
RBC: 5.02 MIL/uL (ref 3.87–5.11)
RDW: 15.3 % (ref 11.5–15.5)
WBC: 11.9 10*3/uL — AB (ref 4.0–10.5)

## 2013-11-19 ENCOUNTER — Ambulatory Visit (INDEPENDENT_AMBULATORY_CARE_PROVIDER_SITE_OTHER): Payer: Medicare Other | Admitting: Internal Medicine

## 2013-11-19 ENCOUNTER — Encounter: Payer: Self-pay | Admitting: Internal Medicine

## 2013-11-19 ENCOUNTER — Ambulatory Visit (INDEPENDENT_AMBULATORY_CARE_PROVIDER_SITE_OTHER)
Admission: RE | Admit: 2013-11-19 | Discharge: 2013-11-19 | Disposition: A | Discharge status: Home / Self Care | Payer: Medicare Other | Source: Ambulatory Visit | Attending: Internal Medicine | Admitting: Internal Medicine

## 2013-11-19 VITALS — BP 102/70 | HR 112 | Ht 68.0 in | Wt 270.4 lb

## 2013-11-19 DIAGNOSIS — Z720 Tobacco use: Secondary | ICD-10-CM

## 2013-11-19 DIAGNOSIS — K219 Gastro-esophageal reflux disease without esophagitis: Secondary | ICD-10-CM

## 2013-11-19 DIAGNOSIS — J42 Unspecified chronic bronchitis: Secondary | ICD-10-CM

## 2013-11-19 DIAGNOSIS — J189 Pneumonia, unspecified organism: Secondary | ICD-10-CM

## 2013-11-19 DIAGNOSIS — J441 Chronic obstructive pulmonary disease with (acute) exacerbation: Secondary | ICD-10-CM | POA: Insufficient documentation

## 2013-11-19 NOTE — Assessment & Plan Note (Signed)
Bronchitis reflecting smoking history and recent pneumonia. She denies productive cough now and is probably close to baseline Recent pneumonia by her description, clinically responding to prednisone and Levaquin. She should clear now to the point that she can go ahead with anticipated surgery. I think she is at risk for recurrent aspiration pneumonias despite counseling and will be at some increased risk of this at the time of her surgery.

## 2013-11-19 NOTE — Assessment & Plan Note (Signed)
Emphasis on reflux precautions to prevent possible aspiration

## 2013-11-19 NOTE — Patient Instructions (Addendum)
Order- office spirometry   Dx Chronic bronchitis, recurrent pneumonia  Order- CXR      Be careful to prevent reflux of stomach juice that can cause you pneumonia. Especially- keep the head of your bed elevated 2-3 inches, and don't eat or drink for at least an hour before lying down.   Please do not smoke !  I will send you a letter for your surgeon after I see today's test results.

## 2013-11-19 NOTE — Progress Notes (Signed)
10/14/13- 56 yoF smoker referred courtesy of Dr Purcell Nails [facility]; Insomnia and Pneumonia-recent stay in [facility] for PNA Hx PTSD, BiPolar, HTN, Crohn's. Mother and brother here Currently smoking 1/4 PPD, usual has been one pack per day. Hospitalized at [facility] August 21 and again September 22 with R lung CAP and hypoxemia- no organism per current available records.  Brings disc of CT chest dated 10/10/2013 showing diffuse right lung pneumonia, clear on the left with some reactive adenopathy. She is finishing Augmentin and prednisone taper, slowly improving with residual cough, clear sputum, no fever, no blood.  History of "bad" GERD. She is currently taking proton X. x2 at bedtime, trying to sleep on a wedge on her sofa. Admits she does "strangle a lot" at night. Reports swallowing evaluation was normal. Chronic pain medication for back pain and multiple arthritic pains. It was also intended that she be seen here for question of sleep disordered breathing. She does snore some and brother says that she holds her breath occasionally. She reports NPSG x 2 at [facility] negative. Bedtime between 7 and 8 PM, sleep latency one hour, waking 3 or 4 times CXR 09/17/13 IMPRESSION:  Decreased right middle lobe airspace disease/pneumonia.  Electronically Signed  By: Laveda Abbe M.D.  On: 09/17/2013 10:49 CT chest 10/10/2013/[place]-diffuse right lung pneumonia, mild reactive right hilar and mediastinal adenopathy, clear left lung.   11/19/13- 33 yoF smoker referred courtesy of Dr Purcell Nails [facility]; Insomnia and Pneumonia-recent stay in [facility] for PNA  Mother here FOLLOWS FOR: pneumonia treated, patient better she needs surgery clearance.  Has had flu and pneumonia vaccines Since last here, treated by her primary physician with prednisone and Levaquin ending today, for pneumonia. Has small residual dry cough chest pain or fever, little wheeze. Now pending right shoulder surgery and asks a letter  about her status for her orthopedic surgeon. Office Spirometry 11/19/13- mild restriction of exhaled volume and mild obstruction. FVC 2.87/74%, FEV1 2.19/70%, FEV1/FVC 0.76, FEF 25-75 percent 1.89/60%  ROS-see HPI Constitutional:   No-   weight loss, night sweats, fevers, chills, fatigue, lassitude. HEENT:   No-  headaches, difficulty swallowing, tooth/dental problems, sore throat,       No-  sneezing, itching, ear ache, nasal congestion, post nasal drip,  CV:  No-   chest pain, orthopnea, PND, swelling in lower extremities, anasarca,                                  dizziness, palpitations Resp: No-   shortness of breath with exertion or at rest.              No-   productive cough,  + non-productive cough,  No- coughing up of blood.              No-   change in color of mucus.  No- wheezing.   Skin: No-   rash or lesions. GI:  No-   heartburn, indigestion, abdominal pain, nausea, vomiting,  GU:  MS:  +  joint pain or swelling.  Neuro-     nothing unusual Psych:  No- change in mood or affect. No depression or anxiety.  No memory loss.  OBJ- Physical Exam General- Alert, Oriented, Affect-appropriate, Distress- none acute, + obese Skin- rash-none, lesions- none, excoriation- none, + tattoos Lymphadenopathy- none Head- atraumatic            Eyes- Gross vision intact, PERRLA, conjunctivae and secretions  clear            Ears- Hearing, canals-normal            Nose- Clear, no-Septal dev, mucus, polyps, erosion, perforation             Throat- Mallampati II , mucosa clear , drainage- none, tonsils- atrophic, + edentulous Neck- flexible , trachea midline, no stridor , thyroid nl, carotid no bruit Chest - symmetrical excursion , unlabored           Heart/CV- RRR , no murmur , no gallop  , no rub, nl s1 s2                           - JVD- none , edema- none, stasis changes- none, varices- none           Lung- clear to P&A, wheeze- none, cough+ with deep breath , dullness-none, rub- none            Chest wall-  Abd-  Br/ Gen/ Rectal- Not done, not indicated Extrem- cyanosis- none, clubbing, none, atrophy- none, strength- nl Neuro- grossly intact to observation

## 2013-11-19 NOTE — Assessment & Plan Note (Signed)
Counseled, making the point that her mother sitting next to her is dependent on portable oxygen because of lung disease.

## 2013-11-19 NOTE — Assessment & Plan Note (Signed)
Clinically resolving after recent pneumonia. She probably maintains some chronic bronchitis with her smoking. Suspect she is recurrently aspirating to cause pneumonia. Plan-office spirometry, chest x-ray

## 2013-11-25 ENCOUNTER — Telehealth: Payer: Self-pay | Admitting: Internal Medicine

## 2013-11-25 NOTE — Telephone Encounter (Signed)
PT informed of cxr results per DR Maple Hudson

## 2013-12-03 ENCOUNTER — Other Ambulatory Visit: Payer: Self-pay | Admitting: Family Medicine

## 2013-12-13 ENCOUNTER — Encounter: Payer: Self-pay | Admitting: *Deleted

## 2013-12-17 ENCOUNTER — Ambulatory Visit: Payer: Self-pay | Admitting: Internal Medicine

## 2013-12-21 DEATH — deceased

## 2013-12-27 ENCOUNTER — Other Ambulatory Visit: Payer: Self-pay | Admitting: Pain Medicine

## 2013-12-27 DIAGNOSIS — M546 Pain in thoracic spine: Secondary | ICD-10-CM

## 2014-01-02 ENCOUNTER — Other Ambulatory Visit: Payer: Self-pay

## 2014-01-05 ENCOUNTER — Telehealth (HOSPITAL_COMMUNITY): Payer: Self-pay | Admitting: *Deleted

## 2014-01-05 NOTE — Telephone Encounter (Signed)
noted 

## 2014-01-05 NOTE — Telephone Encounter (Signed)
this morning 01/05/14, Nolon Rod informed us that patient is deceased.

## 2014-02-01 ENCOUNTER — Ambulatory Visit (HOSPITAL_COMMUNITY): Payer: Self-pay | Admitting: Psychiatry

## 2014-02-15 ENCOUNTER — Ambulatory Visit: Payer: Medicare Other | Admitting: Family Medicine

## 2014-02-15 ENCOUNTER — Ambulatory Visit: Payer: Medicare Other | Admitting: Family

## 2016-04-12 IMAGING — CR DG CHEST 2V
2 series · 2 of 2 positions shown · non-contrast
Comparison: 09/10/2013

CLINICAL DATA: 48-year-old female with shortness of breath and
wheezing.

EXAM:
CHEST  2 VIEW

[view not recorded (1 of 2)]
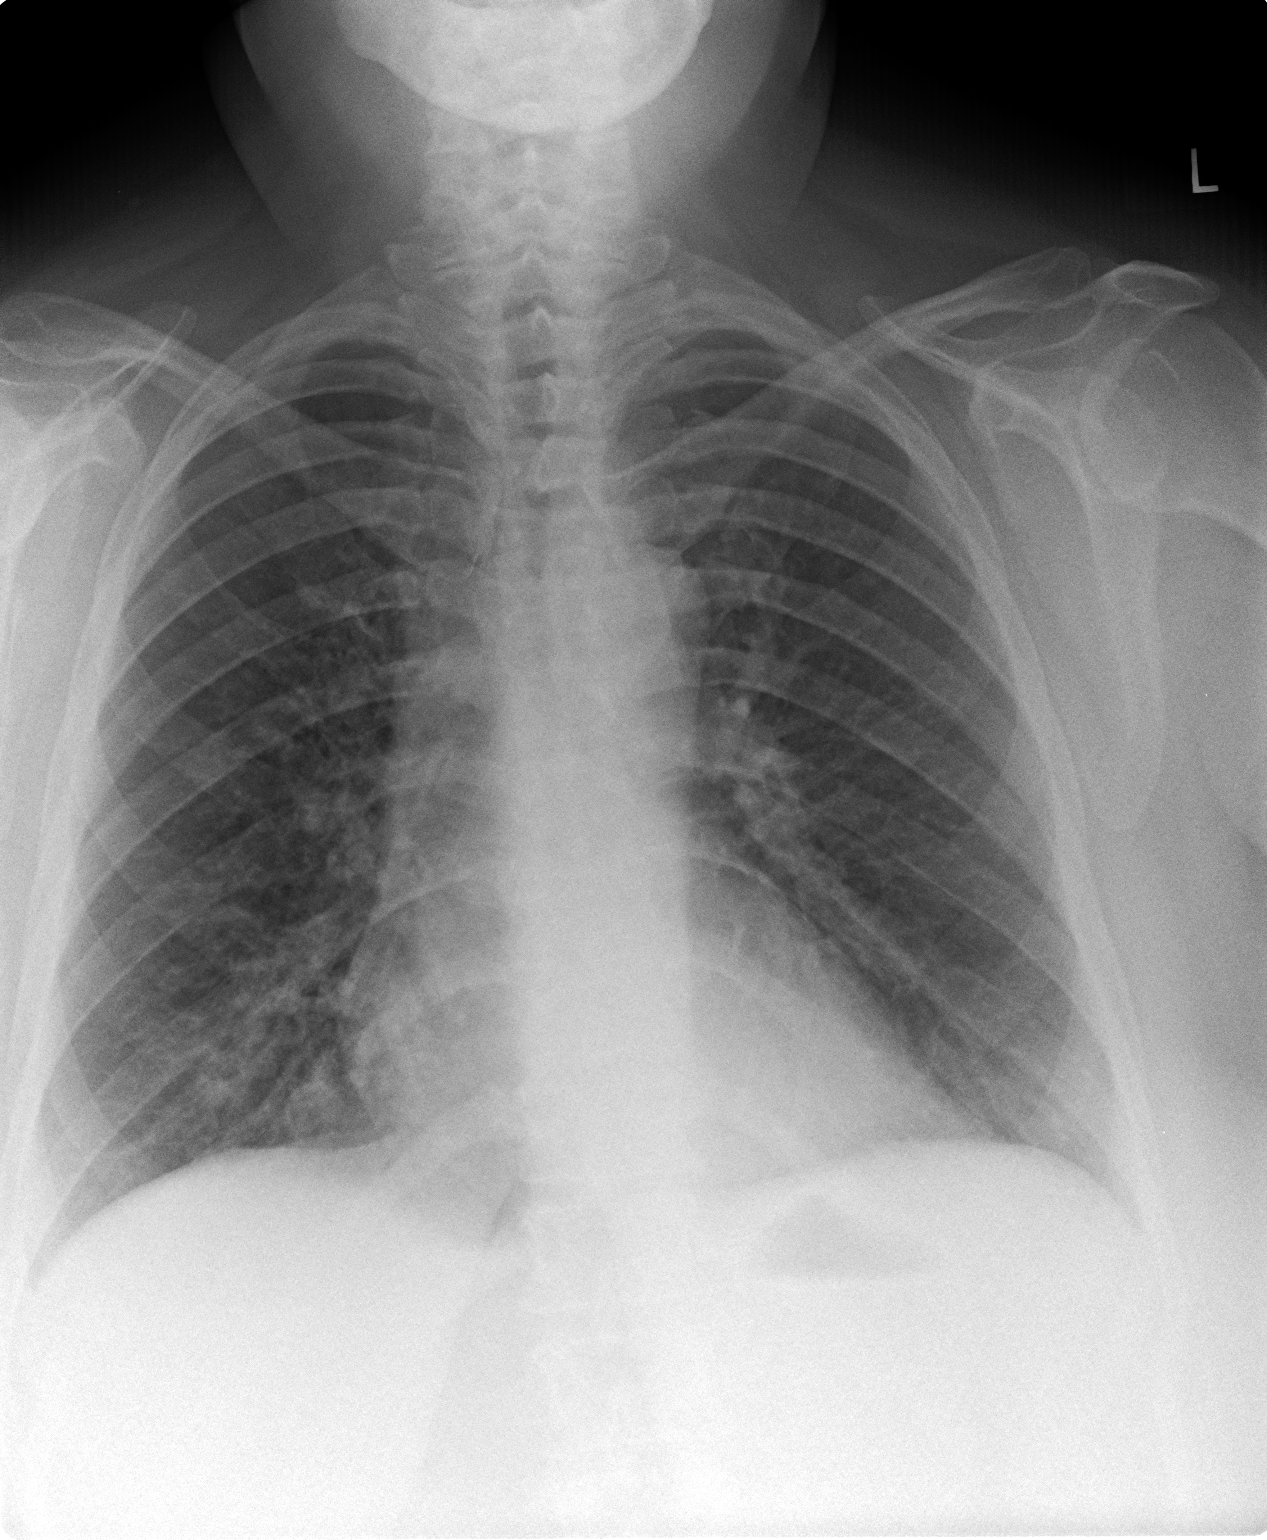

[view not recorded (2 of 2)]
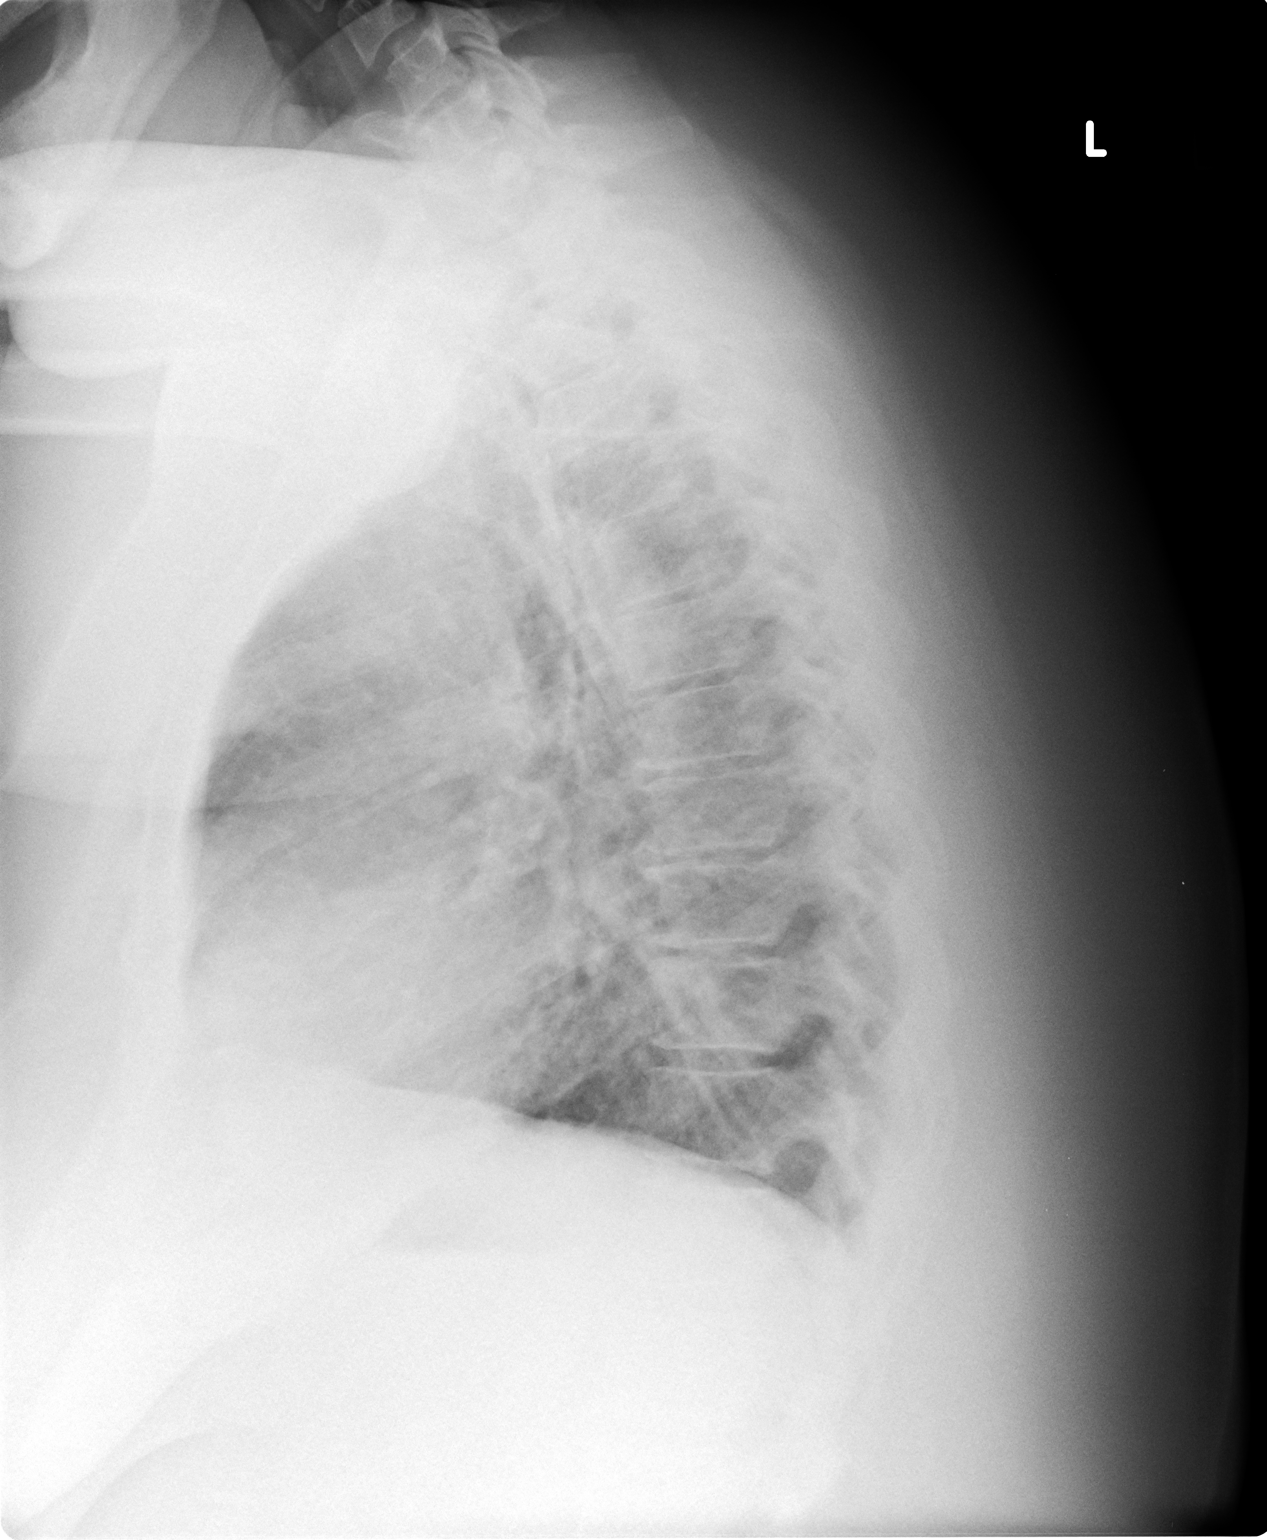

[2 of 2 positions shown; findings below may reference images not displayed]

FINDINGS: Upper limits normal heart size and mild peribronchial thickening
again noted.

Decreased right middle lobe airspace disease noted.

There is no evidence of pulmonary edema, suspicious pulmonary
nodule/mass, pleural effusion, or pneumothorax. No acute bony
abnormalities are identified.
IMPRESSION: Decreased right middle lobe airspace disease/pneumonia.

## 2016-06-14 IMAGING — CR DG CHEST 2V
2 series · 2 of 2 positions shown · non-contrast
Comparison: 10/10/2013

CLINICAL DATA: Shortness of breath, cough, recurrent pneumonia

EXAM:
CHEST  2 VIEW

[view not recorded (1 of 2)]
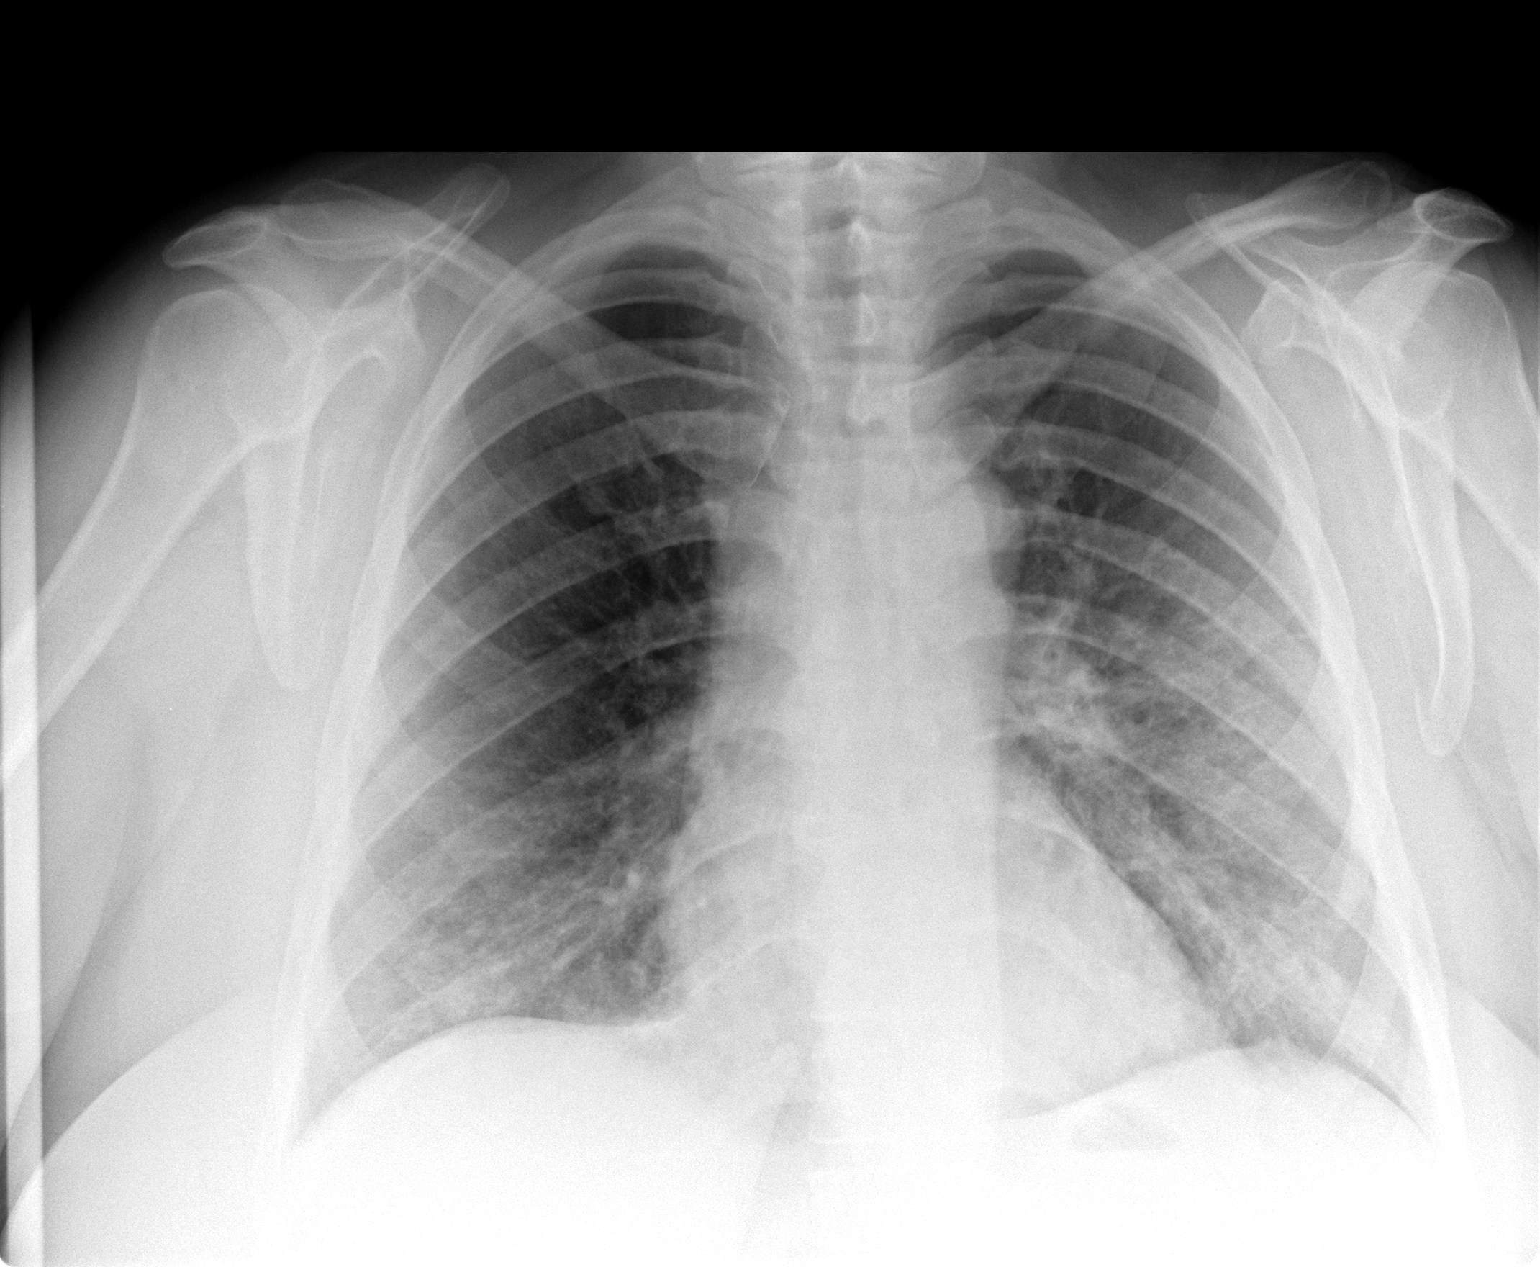

[view not recorded (2 of 2)]
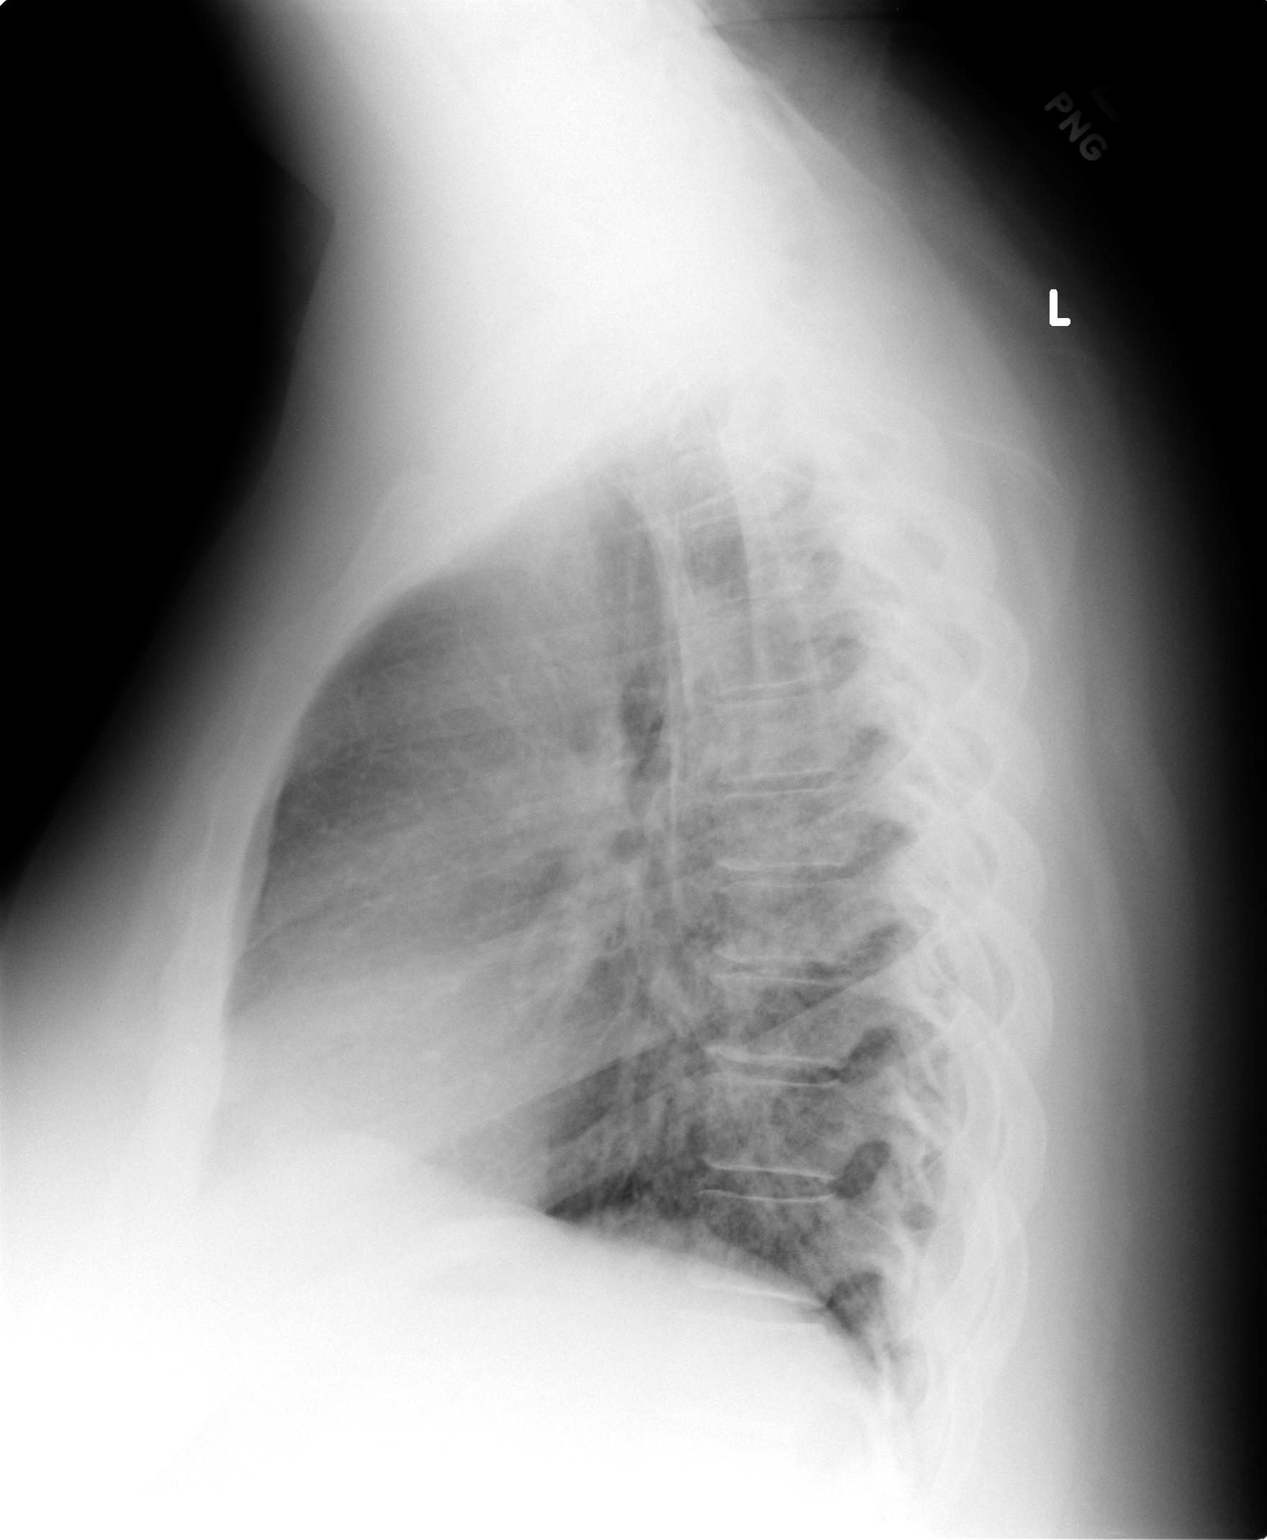

[2 of 2 positions shown; findings below may reference images not displayed]

FINDINGS: Cardiomediastinal silhouette is stable. There is streaky
opacification left perihilar and infrahilar region suspicious for
early infiltrate or atypical pneumonia. No pulmonary edema. Bony
thorax is stable.
IMPRESSION: Streaky opacifications left perihilar and infrahilar region
suspicious for early infiltrate or atypical pneumonia. Follow-up to
resolution is recommended. No pulmonary edema.

## 2016-07-26 ENCOUNTER — Other Ambulatory Visit: Payer: Self-pay | Admitting: Internal Medicine

## 2016-07-26 DIAGNOSIS — J441 Chronic obstructive pulmonary disease with (acute) exacerbation: Secondary | ICD-10-CM
